# Patient Record
Sex: Female | Born: 1965 | Race: White | Hispanic: No | Marital: Single | Smoking: Never smoker
Health system: Southern US, Community
[De-identification: ages and names within clinical notes are randomized; demographics above are authoritative.]

## PROBLEM LIST (undated history)

## (undated) DIAGNOSIS — Z780 Asymptomatic menopausal state: Secondary | ICD-10-CM

## (undated) DIAGNOSIS — F329 Major depressive disorder, single episode, unspecified: Secondary | ICD-10-CM

## (undated) DIAGNOSIS — E538 Deficiency of other specified B group vitamins: Secondary | ICD-10-CM

## (undated) DIAGNOSIS — K644 Residual hemorrhoidal skin tags: Secondary | ICD-10-CM

## (undated) DIAGNOSIS — F419 Anxiety disorder, unspecified: Secondary | ICD-10-CM

## (undated) DIAGNOSIS — E0500 Thyrotoxicosis with diffuse goiter without thyrotoxic crisis or storm: Secondary | ICD-10-CM

## (undated) DIAGNOSIS — F32A Depression, unspecified: Secondary | ICD-10-CM

## (undated) DIAGNOSIS — R251 Tremor, unspecified: Secondary | ICD-10-CM

## (undated) DIAGNOSIS — N762 Acute vulvitis: Secondary | ICD-10-CM

## (undated) DIAGNOSIS — E039 Hypothyroidism, unspecified: Secondary | ICD-10-CM

## (undated) HISTORY — DX: Major depressive disorder, single episode, unspecified: F32.9

## (undated) HISTORY — PX: REFRACTIVE SURGERY: SHX103

## (undated) HISTORY — DX: Deficiency of other specified B group vitamins: E53.8

## (undated) HISTORY — PX: FOOT SURGERY: SHX648

## (undated) HISTORY — DX: Asymptomatic menopausal state: Z78.0

## (undated) HISTORY — DX: Residual hemorrhoidal skin tags: K64.4

## (undated) HISTORY — DX: Tremor, unspecified: R25.1

## (undated) HISTORY — DX: Thyrotoxicosis with diffuse goiter without thyrotoxic crisis or storm: E05.00

## (undated) HISTORY — DX: Hypothyroidism, unspecified: E03.9

## (undated) HISTORY — PX: OTHER SURGICAL HISTORY: SHX169

## (undated) HISTORY — DX: Depression, unspecified: F32.A

## (undated) HISTORY — DX: Acute vulvitis: N76.2

## (undated) HISTORY — DX: Anxiety disorder, unspecified: F41.9

---

## 1998-12-07 ENCOUNTER — Other Ambulatory Visit: Admission: RE | Admit: 1998-12-07 | Discharge: 1998-12-07 | Payer: Self-pay | Admitting: Gynecology

## 1998-12-28 ENCOUNTER — Encounter (INDEPENDENT_AMBULATORY_CARE_PROVIDER_SITE_OTHER): Payer: Self-pay

## 1998-12-28 ENCOUNTER — Other Ambulatory Visit: Admission: RE | Admit: 1998-12-28 | Discharge: 1998-12-28 | Payer: Self-pay | Admitting: *Deleted

## 1999-01-17 ENCOUNTER — Encounter (INDEPENDENT_AMBULATORY_CARE_PROVIDER_SITE_OTHER): Payer: Self-pay | Admitting: Specialist

## 1999-01-17 ENCOUNTER — Other Ambulatory Visit: Admission: RE | Admit: 1999-01-17 | Discharge: 1999-01-17 | Payer: Self-pay | Admitting: *Deleted

## 1999-06-29 ENCOUNTER — Other Ambulatory Visit: Admission: RE | Admit: 1999-06-29 | Discharge: 1999-06-29 | Payer: Self-pay | Admitting: Obstetrics and Gynecology

## 2001-01-01 ENCOUNTER — Other Ambulatory Visit: Admission: RE | Admit: 2001-01-01 | Discharge: 2001-01-01 | Payer: Self-pay | Admitting: Gynecology

## 2003-01-12 ENCOUNTER — Other Ambulatory Visit: Admission: RE | Admit: 2003-01-12 | Discharge: 2003-01-12 | Payer: Self-pay | Admitting: Obstetrics and Gynecology

## 2003-09-07 ENCOUNTER — Other Ambulatory Visit: Admission: RE | Admit: 2003-09-07 | Discharge: 2003-09-07 | Payer: Self-pay | Admitting: Obstetrics and Gynecology

## 2004-10-11 ENCOUNTER — Other Ambulatory Visit: Admission: RE | Admit: 2004-10-11 | Discharge: 2004-10-11 | Payer: Self-pay | Admitting: Obstetrics and Gynecology

## 2005-11-19 ENCOUNTER — Ambulatory Visit: Payer: Self-pay | Admitting: Family Medicine

## 2005-11-21 ENCOUNTER — Ambulatory Visit: Payer: Self-pay | Admitting: Family Medicine

## 2005-11-23 ENCOUNTER — Ambulatory Visit: Payer: Self-pay | Admitting: Family Medicine

## 2006-01-11 ENCOUNTER — Ambulatory Visit: Payer: Self-pay | Admitting: Family Medicine

## 2006-03-15 ENCOUNTER — Ambulatory Visit: Payer: Self-pay | Admitting: Family Medicine

## 2006-07-12 ENCOUNTER — Ambulatory Visit: Payer: Self-pay | Admitting: Family Medicine

## 2006-07-12 LAB — CONVERTED CEMR LAB: TSH: 15.519 microintl units/mL — ABNORMAL HIGH (ref 0.350–5.50)

## 2006-09-02 DIAGNOSIS — F329 Major depressive disorder, single episode, unspecified: Secondary | ICD-10-CM | POA: Insufficient documentation

## 2006-09-02 DIAGNOSIS — Z862 Personal history of diseases of the blood and blood-forming organs and certain disorders involving the immune mechanism: Secondary | ICD-10-CM

## 2006-09-02 DIAGNOSIS — Z8639 Personal history of other endocrine, nutritional and metabolic disease: Secondary | ICD-10-CM

## 2006-10-28 ENCOUNTER — Ambulatory Visit: Payer: Self-pay | Admitting: Family Medicine

## 2006-11-01 LAB — CONVERTED CEMR LAB
T3, Free: 2.9 pg/mL (ref 2.3–4.2)
TSH: 0.18 microintl units/mL — ABNORMAL LOW (ref 0.35–5.50)

## 2006-11-11 ENCOUNTER — Telehealth (INDEPENDENT_AMBULATORY_CARE_PROVIDER_SITE_OTHER): Payer: Self-pay | Admitting: *Deleted

## 2006-12-26 ENCOUNTER — Telehealth (INDEPENDENT_AMBULATORY_CARE_PROVIDER_SITE_OTHER): Payer: Self-pay | Admitting: *Deleted

## 2006-12-27 ENCOUNTER — Ambulatory Visit: Payer: Self-pay | Admitting: Family Medicine

## 2006-12-27 DIAGNOSIS — R5383 Other fatigue: Secondary | ICD-10-CM

## 2006-12-27 DIAGNOSIS — G250 Essential tremor: Secondary | ICD-10-CM

## 2006-12-27 DIAGNOSIS — G252 Other specified forms of tremor: Secondary | ICD-10-CM | POA: Insufficient documentation

## 2006-12-27 DIAGNOSIS — R5381 Other malaise: Secondary | ICD-10-CM

## 2006-12-30 LAB — CONVERTED CEMR LAB
ALT: 20 units/L (ref 0–35)
AST: 20 units/L (ref 0–37)
Alkaline Phosphatase: 41 units/L (ref 39–117)
BUN: 7 mg/dL (ref 6–23)
Basophils Relative: 0.4 % (ref 0.0–1.0)
Bilirubin, Direct: 0.1 mg/dL (ref 0.0–0.3)
CO2: 28 meq/L (ref 19–32)
Calcium: 9.5 mg/dL (ref 8.4–10.5)
Chloride: 106 meq/L (ref 96–112)
Creatinine, Ser: 0.9 mg/dL (ref 0.4–1.2)
Eosinophils Relative: 0.5 % (ref 0.0–5.0)
Glucose, Bld: 87 mg/dL (ref 70–99)
HCT: 39.4 % (ref 36.0–46.0)
Hemoglobin: 13.7 g/dL (ref 12.0–15.0)
MCHC: 34.8 g/dL (ref 30.0–36.0)
Monocytes Absolute: 0.3 10*3/uL (ref 0.2–0.7)
Neutrophils Relative %: 72.3 % (ref 43.0–77.0)
RDW: 13 % (ref 11.5–14.6)
Total Protein: 7.7 g/dL (ref 6.0–8.3)
WBC: 4.5 10*3/uL (ref 4.5–10.5)

## 2007-01-01 ENCOUNTER — Telehealth (INDEPENDENT_AMBULATORY_CARE_PROVIDER_SITE_OTHER): Payer: Self-pay | Admitting: *Deleted

## 2007-01-03 ENCOUNTER — Ambulatory Visit: Payer: Self-pay | Admitting: Family Medicine

## 2007-01-03 DIAGNOSIS — F411 Generalized anxiety disorder: Secondary | ICD-10-CM | POA: Insufficient documentation

## 2007-04-08 ENCOUNTER — Telehealth (INDEPENDENT_AMBULATORY_CARE_PROVIDER_SITE_OTHER): Payer: Self-pay | Admitting: *Deleted

## 2007-04-10 ENCOUNTER — Ambulatory Visit: Payer: Self-pay | Admitting: Family Medicine

## 2007-04-11 LAB — CONVERTED CEMR LAB: TSH: 4.08 microintl units/mL (ref 0.35–5.50)

## 2007-08-26 ENCOUNTER — Ambulatory Visit: Payer: Self-pay | Admitting: Family Medicine

## 2007-08-26 DIAGNOSIS — N760 Acute vaginitis: Secondary | ICD-10-CM | POA: Insufficient documentation

## 2007-08-26 DIAGNOSIS — K5900 Constipation, unspecified: Secondary | ICD-10-CM | POA: Insufficient documentation

## 2007-08-28 ENCOUNTER — Telehealth (INDEPENDENT_AMBULATORY_CARE_PROVIDER_SITE_OTHER): Payer: Self-pay | Admitting: *Deleted

## 2007-08-28 ENCOUNTER — Encounter (INDEPENDENT_AMBULATORY_CARE_PROVIDER_SITE_OTHER): Payer: Self-pay | Admitting: *Deleted

## 2007-09-02 ENCOUNTER — Ambulatory Visit: Payer: Self-pay | Admitting: Family Medicine

## 2007-09-02 LAB — CONVERTED CEMR LAB
T3, Free: 2.7 pg/mL (ref 2.3–4.2)
TSH: 0.97 microintl units/mL (ref 0.35–5.50)

## 2007-09-03 ENCOUNTER — Encounter (INDEPENDENT_AMBULATORY_CARE_PROVIDER_SITE_OTHER): Payer: Self-pay | Admitting: *Deleted

## 2008-01-21 ENCOUNTER — Telehealth (INDEPENDENT_AMBULATORY_CARE_PROVIDER_SITE_OTHER): Payer: Self-pay | Admitting: *Deleted

## 2008-01-28 ENCOUNTER — Encounter (INDEPENDENT_AMBULATORY_CARE_PROVIDER_SITE_OTHER): Payer: Self-pay | Admitting: *Deleted

## 2008-02-09 ENCOUNTER — Encounter: Payer: Self-pay | Admitting: Family Medicine

## 2008-02-10 ENCOUNTER — Ambulatory Visit: Payer: Self-pay

## 2008-02-18 ENCOUNTER — Telehealth: Payer: Self-pay | Admitting: Family Medicine

## 2008-06-29 ENCOUNTER — Ambulatory Visit: Payer: Self-pay | Admitting: Family Medicine

## 2008-09-30 ENCOUNTER — Ambulatory Visit: Payer: Self-pay | Admitting: Family Medicine

## 2008-09-30 DIAGNOSIS — K644 Residual hemorrhoidal skin tags: Secondary | ICD-10-CM | POA: Insufficient documentation

## 2008-10-01 ENCOUNTER — Encounter (INDEPENDENT_AMBULATORY_CARE_PROVIDER_SITE_OTHER): Payer: Self-pay | Admitting: *Deleted

## 2008-10-18 ENCOUNTER — Telehealth (INDEPENDENT_AMBULATORY_CARE_PROVIDER_SITE_OTHER): Payer: Self-pay | Admitting: *Deleted

## 2008-10-21 ENCOUNTER — Encounter (INDEPENDENT_AMBULATORY_CARE_PROVIDER_SITE_OTHER): Payer: Self-pay | Admitting: *Deleted

## 2008-10-21 ENCOUNTER — Ambulatory Visit: Payer: Self-pay | Admitting: Family Medicine

## 2008-10-21 LAB — CONVERTED CEMR LAB: TSH: 7.21 microintl units/mL — ABNORMAL HIGH (ref 0.35–5.50)

## 2008-11-12 ENCOUNTER — Ambulatory Visit: Payer: Self-pay | Admitting: Family Medicine

## 2008-11-12 DIAGNOSIS — R079 Chest pain, unspecified: Secondary | ICD-10-CM

## 2008-11-15 ENCOUNTER — Telehealth (INDEPENDENT_AMBULATORY_CARE_PROVIDER_SITE_OTHER): Payer: Self-pay | Admitting: *Deleted

## 2008-11-16 ENCOUNTER — Telehealth (INDEPENDENT_AMBULATORY_CARE_PROVIDER_SITE_OTHER): Payer: Self-pay | Admitting: *Deleted

## 2008-12-23 ENCOUNTER — Telehealth (INDEPENDENT_AMBULATORY_CARE_PROVIDER_SITE_OTHER): Payer: Self-pay | Admitting: *Deleted

## 2009-01-27 ENCOUNTER — Ambulatory Visit: Payer: Self-pay | Admitting: Cardiology

## 2009-01-28 ENCOUNTER — Ambulatory Visit: Payer: Self-pay | Admitting: Cardiology

## 2009-01-31 LAB — CONVERTED CEMR LAB
ALT: 10 units/L (ref 0–35)
AST: 19 units/L (ref 0–37)
Alkaline Phosphatase: 37 units/L — ABNORMAL LOW (ref 39–117)
Bilirubin, Direct: 0 mg/dL (ref 0.0–0.3)
Cholesterol: 169 mg/dL (ref 0–200)
Total Bilirubin: 0.8 mg/dL (ref 0.3–1.2)
Total Protein: 7.1 g/dL (ref 6.0–8.3)
VLDL: 19.2 mg/dL (ref 0.0–40.0)

## 2009-02-01 LAB — CONVERTED CEMR LAB
ALT: 10 units/L (ref 0–35)
AST: 13 units/L (ref 0–37)
Albumin: 3.6 g/dL (ref 3.5–5.2)
Alkaline Phosphatase: 38 units/L — ABNORMAL LOW (ref 39–117)
Cholesterol: 168 mg/dL (ref 0–200)
HDL: 43.3 mg/dL (ref >39.00)
Total Protein: 6.9 g/dL (ref 6.0–8.3)
Triglycerides: 94 mg/dL (ref 0.0–149.0)

## 2009-02-14 ENCOUNTER — Ambulatory Visit: Payer: Self-pay

## 2009-02-14 ENCOUNTER — Ambulatory Visit: Payer: Self-pay | Admitting: Cardiology

## 2009-03-14 ENCOUNTER — Telehealth: Payer: Self-pay | Admitting: Cardiology

## 2009-03-15 DIAGNOSIS — R0602 Shortness of breath: Secondary | ICD-10-CM

## 2009-04-25 ENCOUNTER — Ambulatory Visit: Payer: Self-pay | Admitting: Internal Medicine

## 2009-06-14 ENCOUNTER — Ambulatory Visit: Payer: Self-pay | Admitting: Family Medicine

## 2009-06-14 DIAGNOSIS — Z780 Asymptomatic menopausal state: Secondary | ICD-10-CM | POA: Insufficient documentation

## 2009-06-15 ENCOUNTER — Telehealth (INDEPENDENT_AMBULATORY_CARE_PROVIDER_SITE_OTHER): Payer: Self-pay | Admitting: *Deleted

## 2009-06-15 ENCOUNTER — Encounter (INDEPENDENT_AMBULATORY_CARE_PROVIDER_SITE_OTHER): Payer: Self-pay | Admitting: *Deleted

## 2009-06-17 ENCOUNTER — Telehealth: Payer: Self-pay | Admitting: Family Medicine

## 2009-06-20 ENCOUNTER — Telehealth (INDEPENDENT_AMBULATORY_CARE_PROVIDER_SITE_OTHER): Payer: Self-pay | Admitting: *Deleted

## 2009-06-20 LAB — CONVERTED CEMR LAB
Basophils Relative: 0.9 % (ref 0.0–3.0)
CO2: 28 meq/L (ref 19–32)
Calcium: 9.2 mg/dL (ref 8.4–10.5)
Chloride: 108 meq/L (ref 96–112)
Eosinophils Absolute: 0 10*3/uL (ref 0.0–0.7)
Folate: 8.9 ng/mL
Free T4: 1.3 ng/dL (ref 0.6–1.6)
HCT: 38.3 % (ref 36.0–46.0)
Hemoglobin: 12.7 g/dL (ref 12.0–15.0)
Lymphocytes Relative: 26.5 % (ref 12.0–46.0)
Lymphs Abs: 1.5 10*3/uL (ref 0.7–4.0)
MCHC: 33.1 g/dL (ref 30.0–36.0)
MCV: 98.2 fL (ref 78.0–100.0)
Neutro Abs: 3.6 10*3/uL (ref 1.4–7.7)
Potassium: 4.3 meq/L (ref 3.5–5.1)
RBC: 3.9 M/uL (ref 3.87–5.11)
Sodium: 139 meq/L (ref 135–145)

## 2009-06-21 ENCOUNTER — Ambulatory Visit: Payer: Self-pay | Admitting: Family Medicine

## 2009-06-21 DIAGNOSIS — E538 Deficiency of other specified B group vitamins: Secondary | ICD-10-CM

## 2009-06-28 ENCOUNTER — Ambulatory Visit: Payer: Self-pay | Admitting: Family Medicine

## 2009-07-08 ENCOUNTER — Ambulatory Visit: Payer: Self-pay | Admitting: Family Medicine

## 2009-07-13 ENCOUNTER — Ambulatory Visit: Payer: Self-pay | Admitting: Family Medicine

## 2009-08-24 ENCOUNTER — Ambulatory Visit: Payer: Self-pay | Admitting: Family Medicine

## 2009-08-26 LAB — CONVERTED CEMR LAB: Vitamin B-12: 326 pg/mL (ref 211–911)

## 2009-10-05 ENCOUNTER — Telehealth: Payer: Self-pay | Admitting: Family Medicine

## 2009-10-13 ENCOUNTER — Encounter (INDEPENDENT_AMBULATORY_CARE_PROVIDER_SITE_OTHER): Payer: Self-pay | Admitting: *Deleted

## 2009-12-09 ENCOUNTER — Telehealth (INDEPENDENT_AMBULATORY_CARE_PROVIDER_SITE_OTHER): Payer: Self-pay | Admitting: *Deleted

## 2009-12-09 DIAGNOSIS — K625 Hemorrhage of anus and rectum: Secondary | ICD-10-CM | POA: Insufficient documentation

## 2009-12-13 ENCOUNTER — Encounter (INDEPENDENT_AMBULATORY_CARE_PROVIDER_SITE_OTHER): Payer: Self-pay | Admitting: *Deleted

## 2010-04-05 ENCOUNTER — Encounter (INDEPENDENT_AMBULATORY_CARE_PROVIDER_SITE_OTHER): Payer: Self-pay | Admitting: *Deleted

## 2010-05-16 ENCOUNTER — Ambulatory Visit: Payer: Self-pay | Admitting: Family Medicine

## 2010-05-16 DIAGNOSIS — E039 Hypothyroidism, unspecified: Secondary | ICD-10-CM

## 2010-05-17 LAB — CONVERTED CEMR LAB: Free T4: 1.21 ng/dL (ref 0.60–1.60)

## 2010-06-09 ENCOUNTER — Ambulatory Visit
Admission: RE | Admit: 2010-06-09 | Discharge: 2010-06-09 | Payer: Self-pay | Source: Home / Self Care | Attending: Internal Medicine | Admitting: Internal Medicine

## 2010-06-25 LAB — CONVERTED CEMR LAB
BUN: 13 mg/dL (ref 6–23)
Basophils Absolute: 0 10*3/uL (ref 0.0–0.1)
CK-MB: 0.5 ng/mL (ref 0.3–4.0)
Chloride: 104 meq/L (ref 96–112)
Eosinophils Absolute: 0 10*3/uL (ref 0.0–0.7)
GFR calc non Af Amer: 71.9 mL/min (ref >60)
Hemoglobin: 12.6 g/dL (ref 12.0–15.0)
Lymphocytes Relative: 25.7 % (ref 12.0–46.0)
MCHC: 34.5 g/dL (ref 30.0–36.0)
Monocytes Relative: 7.1 % (ref 3.0–12.0)
Neutro Abs: 4.6 10*3/uL (ref 1.4–7.7)
Neutrophils Relative %: 66.8 % (ref 43.0–77.0)
Potassium: 3.9 meq/L (ref 3.5–5.1)
RBC: 3.83 M/uL — ABNORMAL LOW (ref 3.87–5.11)
RDW: 12.9 % (ref 11.5–14.6)
Sodium: 141 meq/L (ref 135–145)
Total CK: 31 units/L (ref 7–177)

## 2010-06-27 NOTE — Assessment & Plan Note (Signed)
Summary: 1st b12 injection//fd  Nurse Visit   Allergies: 1)  ! Codeine  Medication Administration  Injection # 1:    Medication: Vit B12 1000 mcg    Diagnosis: B12 DEFICIENCY (ICD-266.2)    Route: IM    Site: R deltoid    Exp Date: 02/2011    Lot #: [id number]    Mfr: American Regent    Patient tolerated injection without complications    Given by: Floydene Flock (June 21, 2009 1:26 PM)  Orders Added: 1)  Admin of Therapeutic Inj  intramuscular or subcutaneous [96372] 2)  Vit B12 1000 mcg [J3420]   Medication Administration  Injection # 1:    Medication: Vit B12 1000 mcg    Diagnosis: B12 DEFICIENCY (ICD-266.2)    Route: IM    Site: R deltoid    Exp Date: 02/2011    Lot #: [id number]    Mfr: American Regent    Patient tolerated injection without complications    Given by: Floydene Flock (June 21, 2009 1:26 PM)  Orders Added: 1)  Admin of Therapeutic Inj  intramuscular or subcutaneous [96372] 2)  Vit B12 1000 mcg [J3420]

## 2010-06-27 NOTE — Assessment & Plan Note (Signed)
Summary: b12 shot/kdc  Nurse Visit   Allergies: 1)  ! Codeine  Medication Administration  Injection # 1:    Medication: Vit B12 1000 mcg    Diagnosis: B12 DEFICIENCY (ICD-266.2)    Route: IM    Site: R deltoid    Exp Date: 02/2011    Lot #: [id number]    Mfr: American Regent    Patient tolerated injection without complications    Given by: Floydene Flock (July 08, 2009 4:01 PM)  Orders Added: 1)  Vit B12 1000 mcg [J3420] 2)  Admin of Therapeutic Inj  intramuscular or subcutaneous [96372]   Medication Administration  Injection # 1:    Medication: Vit B12 1000 mcg    Diagnosis: B12 DEFICIENCY (ICD-266.2)    Route: IM    Site: R deltoid    Exp Date: 02/2011    Lot #: [id number]    Mfr: American Regent    Patient tolerated injection without complications    Given by: Rene Kocher Hoops (July 08, 2009 4:01 PM)  Orders Added: 1)  Vit B12 1000 mcg [J3420] 2)  Admin of Therapeutic Inj  intramuscular or subcutaneous [43329]

## 2010-06-27 NOTE — Progress Notes (Signed)
Summary: Lab Results   Phone Note Outgoing Call   Summary of Call: Regarding lab results, LMTCB:  pt probably is perimenopausal but we need rest of labs.  once all labs are back---fax to gyn Signed by Loreen Freud DO on 06/15/2009 at 6:32 AM  Follow-up for Phone Call        Pt is aware. Army Fossa CMA  June 22, 2009 11:30 AM

## 2010-06-27 NOTE — Progress Notes (Signed)
Summary: Lab Results   Phone Note Call from Patient Call back at Home Phone [phone]   Complaint: Breathing Problems Summary of Call: Pt called and would like to know her lab results, mostly if her thyroid medication is going to change. Please advise. Army Fossa CMA  June 17, 2009 3:48 PM   Follow-up for Phone Call        Pt is aware to continue same dosage of thyroid meds. Army Fossa CMA  June 17, 2009 5:08 PM      Prescriptions: LEVOXYL 150 MCG TABS (LEVOTHYROXINE SODIUM) Take one tablet daily  #30.0 Each x 2   Entered by:   Army Fossa CMA   Authorized by:   Loreen Freud DO   Signed by:   Army Fossa CMA on 06/17/2009   Method used:   Electronically to        [id number]* (retail)       [address]       [place], [place]  [address]       Ph: [phone]       Fax: [phone]   RxID:   [id number]

## 2010-06-27 NOTE — Assessment & Plan Note (Signed)
Summary: FEELING "OUT OF SORTS", EARLY MENOPAUSE??///SPH   Vital Signs:  Patient profile:   45 year old female Weight:      138 pounds Temp:     98.6 degrees F oral Pulse rate:   85 / minute Pulse rhythm:   regular BP sitting:   118 / 76  (left arm) Cuff size:   regular  Vitals Entered By: Army Fossa CMA (June 14, 2009 9:50 AM) CC: Pt here c/o feeling "like she is in a fog" would like to discuss having her hormone levels checked. , Depressive symptoms   History of Present Illness:  Depressive symptoms      This is a 45 year old woman who presents with Depressive symptoms.  The patient reports depressed mood and loss of interest/pleasure, but denies significant weight loss, significant weight gain, insomnia, hypersomnia, psychomotor agitation, and psychomotor retardation.  The patient also reports fatigue or loss of energy, feelings of worthlessness, and diminished concentration.  The patient denies indecisiveness, thoughts of death, thoughts of suicide, suicidal intent, and suicidal plans.  Patient's past history includes depression.  The patient denies abnormally elevated mood, abnormally irritable mood, decreased need for sleep, increased talkativeness, distractibility, flight of ideas, increased goal-directed activity, and inflated self-esteem/ grandiosity.  Pt concerned about menopause.  Periods are changing but have not stopped.    Preventive Screening-Counseling & Management  Alcohol-Tobacco     Smoking Status: never  Caffeine-Diet-Exercise     Does Patient Exercise: no     Exercise Counseling: to improve exercise regimen  Current Medications (verified): 1)  Levoxyl 150 Mcg Tabs (Levothyroxine Sodium) .... Take One Tablet Daily 2)  Klonopin 1 Mg Tabs (Clonazepam) .Marland Kitchen.. 1 By Mouth Two Times A Day 3)  Paxil Cr 25 Mg Xr24h-Tab (Paroxetine Hcl) .Marland Kitchen.. 1 By Mouth Once Daily  Allergies: 1)  ! Codeine  Past History:  Past medical, surgical, family and social histories  (including risk factors) reviewed for relevance to current acute and chronic problems.  Past Medical History: Reviewed history from 04/25/2009 and no changes required. CHEST PAIN (ICD-786.50)     - onset May 2010     - neg gxt 02/14/2009 EXTERNAL HEMORRHOIDS WITHOUT MENTION COMP (ICD-455.3) FAMILY HISTORY DEPRESSION (ICD-V17.0) VULVITIS (ICD-616.10) CONSTIPATION (ICD-564.00) ANXIETY STATE NOS (ICD-300.00) MALAISE AND FATIGUE (ICD-780.79) TREMOR NEC (ICD-333.1) DEPRESSION (ICD-311) GRAVE'S DISEASE, HX OF (ICD-V12.2)  Past Surgical History: Reviewed history from 01/27/2009 and no changes required. None  Family History: Reviewed history from 04/25/2009 and no changes required. History Depression Significant for liver cancer in father who died at age 32. Mother  with HTN and High Cholesterol, she recently had three stents (age 42)  Brother with Lyme disease. Sister healthy neg clotting disorders  Social History: Reviewed history from 01/27/2009 and no changes required. Married with 2 children (age 11, 75) Alcohol Use - no She does not drink excess caffeine Never smoked. Does Patient Exercise:  no  Review of Systems      See HPI  Physical Exam  General:  Well-developed,well-nourished,in no acute distress; alert,appropriate and cooperative throughout examination Psych:  Oriented X3, normally interactive, good eye contact, depressed affect, and subdued.     Impression & Recommendations:  Problem # 1:  DEPRESSION (ICD-311)  Her updated medication list for this problem includes:    Klonopin 1 Mg Tabs (Clonazepam) .Marland Kitchen... 1 by mouth two times a day    Paxil Cr 25 Mg Xr24h-tab (Paroxetine hcl) .Marland Kitchen... 1 by mouth once daily  Orders: Venipuncture (29562) TLB-B12 +  Folate Pnl (82746_82607-B12/FOL) TLB-BMP (Basic Metabolic Panel-BMET) (80048-METABOL) TLB-CBC Platelet - w/Differential (85025-CBCD) TLB-TSH (Thyroid Stimulating Hormone) (84443-TSH) TLB-T3, Free (Triiodothyronine)  (84481-T3FREE) TLB-T4 (Thyrox), Free [phone]) TLB-Luteinizing Hormone (LH) (83002-LH) TLB-FSH (Follicle Stimulating Hormone) (83001-FSH) T- * Misc. Laboratory test [phone])  Discussed treatment options, including trial of antidpressant medication. Will refer to behavioral health. Follow-up call in in 24-48 hours and recheck in 2 weeks, sooner as needed. Patient agrees to call if any worsening of symptoms or thoughts of doing harm arise. Verified that the patient has no suicidal ideation at this time.   Problem # 2:  PERIMENOPAUSAL STATUS (ICD-V49.81)  Orders: Gynecologic Referral (Gyn) Venipuncture (64403) TLB-B12 + Folate Pnl (47425_95638-V56/EPP) TLB-BMP (Basic Metabolic Panel-BMET) (80048-METABOL) TLB-CBC Platelet - w/Differential (85025-CBCD) TLB-TSH (Thyroid Stimulating Hormone) (84443-TSH) TLB-T3, Free (Triiodothyronine) (84481-T3FREE) TLB-T4 (Thyrox), Free [phone]) TLB-Luteinizing Hormone (LH) (83002-LH) TLB-FSH (Follicle Stimulating Hormone) (83001-FSH) T- * Misc. Laboratory test [phone]) T- * Misc. Laboratory test [phone])  Problem # 3:  GRAVE'S DISEASE, HX OF (ICD-V12.2)  Orders: Venipuncture (93235) TLB-B12 + Folate Pnl (57322_02542-H06/CBJ) TLB-BMP (Basic Metabolic Panel-BMET) (80048-METABOL) TLB-CBC Platelet - w/Differential (85025-CBCD) TLB-TSH (Thyroid Stimulating Hormone) (84443-TSH) TLB-T3, Free (Triiodothyronine) (84481-T3FREE) TLB-T4 (Thyrox), Free [phone]) TLB-Luteinizing Hormone (LH) (83002-LH) TLB-FSH (Follicle Stimulating Hormone) (83001-FSH) T- * Misc. Laboratory test [phone])  Problem # 4:  MALAISE AND FATIGUE (ICD-780.79)  Complete Medication List: 1)  Levoxyl 150 Mcg Tabs (Levothyroxine sodium) .... Take one tablet daily 2)  Klonopin 1 Mg Tabs (Clonazepam) .Marland Kitchen.. 1 by mouth two times a day 3)  Paxil Cr 25 Mg Xr24h-tab (Paroxetine hcl) .Marland Kitchen.. 1 by mouth once daily Prescriptions: PAXIL CR 25 MG XR24H-TAB (PAROXETINE HCL) 1 by mouth once daily   #30 x 2   Entered and Authorized by:   Loreen Freud DO   Signed by:   Loreen Freud DO on 06/14/2009   Method used:   Print then Give to Patient   RxID:   [id number] PAXIL CR 12.5 MG XR24H-TAB (PAROXETINE HCL) 1 by mouth once daily for 1 week then increase to 2 a day  #60 x 0   Entered and Authorized by:   Loreen Freud DO   Signed by:   Loreen Freud DO on 06/14/2009   Method used:   Electronically to        [id number]* (retail)       [address]       [place], [place]  [address]       Ph: [phone]       Fax: [phone]   RxID:   [id number]

## 2010-06-27 NOTE — Progress Notes (Signed)
Summary: IMPORTANT- Triage (Rectal Bleeding)  Phone Note Call from Patient Call back at Home Phone [phone]   Caller: Patient Summary of Call: Message left on Triage VM: Patient was referred to GI before and did NOT go. Now patient would like to reconsider due to rectal bleeding, patient would like to see the lady GI doctor and would like for [place] to set up so she can get in sooner.    Shonna Chock CMA  December 09, 2009 1:55 PM   Follow-up for Phone Call        pt c/o rectal bleeding on and off. pt denies any abdominal pain or swelling or tenderness. pt states that she has a hx of hemorrhoid.........Marland KitchenFelecia Deloach CMA  December 09, 2009 2:41 PM   Additional Follow-up for Phone Call Additional follow up Details #1::        If it is no worse than before --she can wait to see GI ---if worse than before she needs to go to ER or uC today Additional Follow-up by: Loreen Freud DO,  December 09, 2009 4:43 PM  New Problems: RECTAL BLEEDING (ICD-569.3)   Additional Follow-up for Phone Call Additional follow up Details #2::    spoke w/ patient says she isn't going to urgent care or emergency room will just wait for GI referra to be competed.........Marland KitchenDoristine Devoid CMA  December 09, 2009 4:47 PM   New Problems: RECTAL BLEEDING (ICD-569.3)

## 2010-06-27 NOTE — Letter (Signed)
Summary: New Patient letter  [facility]  [address] [place], [place] [address]   Phone: [phone]  Fax: [phone]       12/13/2009 MRN: [id number]  [facility] [address] [place], [place]  [address]  Dear Diamond Watts,  Welcome to the Gastroenterology Division at [facility].    You are scheduled to see Dr.  [name] on February 21, 2010 at 9:15am on the 3rd floor at [facility], [address].  We ask that you try to arrive at our office 15 minutes prior to your appointment time to allow for check-in.  We would like you to complete the enclosed self-administered evaluation form prior to your visit and bring it with you on the day of your appointment.  We will review it with you.  Also, please bring a complete list of all your medications or, if you prefer, bring the medication bottles and we will list them.  Please bring your insurance card so that we may make a copy of it.  If your insurance requires a referral to see a specialist, please bring your referral form from your primary care physician.  Co-payments are due at the time of your visit and may be paid by cash, check or credit card.     Your office visit will consist of a consult with your physician (includes a physical exam), any laboratory testing he/she may order, scheduling of any necessary diagnostic testing (e.g. x-ray, ultrasound, CT-scan), and scheduling of a procedure (e.g. Endoscopy, Colonoscopy) if required.  Please allow enough time on your schedule to allow for any/all of these possibilities.    If you cannot keep your appointment, please call [phone] to cancel or reschedule prior to your appointment date.  This allows [place] the opportunity to schedule an appointment for another patient in need of care.  If you do not cancel or reschedule by 5 p.m. the business day prior to your appointment date, you will be charged a $50.00 late cancellation/no-show fee.    Thank you  for choosing [facility] for your medical needs.  We appreciate the opportunity to care for you.  Please visit [place] at our website  to learn more about our practice.                     Sincerely,                                                             The Gastroenterology Division

## 2010-06-27 NOTE — Progress Notes (Signed)
Summary: Paxil -(lmom 5/16,5/17,5/18)  Phone Note Call from Patient Call back at [facility] Phone [phone]   Summary of Call: Pt called and is now interested in weaning off Paxil. Please advise. Army Fossa CMA  Oct 05, 2009 4:41 PM   Follow-up for Phone Call        lower to paxil cr 12.5 1 by mouth once daily for at least 2 weeks and up to 4. Follow-up by: Loreen Freud DO,  Oct 05, 2009 4:58 PM  Additional Follow-up for Phone Call Additional follow up Details #1::        Left message on pts home and work number. Army Fossa CMA  Oct 06, 2009 8:16 AM     Additional Follow-up for Phone Call Additional follow up Details #2::    Called number listed as work number(verified corret number dialed) and was told by a female that I had the wrong number " I dont know an Jaaliyah" the man stated.  Left message on machine for patient to return call when avaliable, Reason for call:  Discuss Instruction for weaning off med./Chrae [facility]  Oct 07, 2009 11:49 AM    Patient called back to say that going from 50mg  to 12.5mg  seems like a big jump. Patient said she would like for me to run this back by [facility] and make sure that she is certain about that. Patient said she was thinking 25mg  x 1 week then 12.mg x 1-2 weeks then d/c. Patient said to just ask Dr.Lowne about this again (aware Dr.Lowne out of the office)  Dr.Lowne please see patient's response to your instruction./Chrae [facility]  Oct 07, 2009 4:44 PM    Additional Follow-up for Phone Call Additional follow up Details #3:: Details for Additional Follow-up Action Taken: Paxil cr 25 mg was what was on her med list----- she is correct she needs to take 25 first for 2 weeks  then 12.5-----just take 2 of the 12.5 mg  and then 1 tab  left message for pt to call back. Army Fossa CMA  Oct 10, 2009 8:36 AM  left message to call back. Army Fossa CMA  Oct 11, 2009 10:08 AM  left message to call back. Army Fossa CMA  Oct 12, 2009 12:05  PM  mailed pt a letter to contact the office. Army Fossa CMA  Oct 13, 2009 8:13 AM   Additional Follow-up by: Loreen Freud DO,  Oct 08, 2009 6:59 AM  New/Updated Medications: PAXIL CR 12.5 MG XR24H-TAB (PAROXETINE HCL) 1 by mouth once daily Prescriptions: PAXIL CR 12.5 MG XR24H-TAB (PAROXETINE HCL) 1 by mouth once daily  #30 x 0   Entered and Authorized by:   Loreen Freud DO   Signed by:   Loreen Freud DO on 10/05/2009   Method used:   Electronically to        [id number]* (retail)       [address]       [place], [place]  [address]       Ph: [phone]       Fax: [phone]   RxID:   [id number]

## 2010-06-27 NOTE — Letter (Signed)
Summary: New Patient letter  [facility]  [address] [place], [place] [address]   Phone: [phone]  Fax: [phone]       04/05/2010 MRN: [id number]  [facility] [address] [place], [place]  [address]  Dear Ms. Friedhoff,  Welcome to the Gastroenterology Division at [facility].    You are scheduled to see Dr. Juanda Chance on 06/09/2010 at 2:45PM on the 3rd floor at [facility], [address].  We ask that you try to arrive at our office 15 minutes prior to your appointment time to allow for check-in.  We would like you to complete the enclosed self-administered evaluation form prior to your visit and bring it with you on the day of your appointment.  We will review it with you.  Also, please bring a complete list of all your medications or, if you prefer, bring the medication bottles and we will list them.  Please bring your insurance card so that we may make a copy of it.  If your insurance requires a referral to see a specialist, please bring your referral form from your primary care physician.  Co-payments are due at the time of your visit and may be paid by cash, check or credit card.     Your office visit will consist of a consult with your physician (includes a physical exam), any laboratory testing he/she may order, scheduling of any necessary diagnostic testing (e.g. x-ray, ultrasound, CT-scan), and scheduling of a procedure (e.g. Endoscopy, Colonoscopy) if required.  Please allow enough time on your schedule to allow for any/all of these possibilities.    If you cannot keep your appointment, please call [phone] to cancel or reschedule prior to your appointment date.  This allows [place] the opportunity to schedule an appointment for another patient in need of care.  If you do not cancel or reschedule by 5 p.m. the business day prior to your appointment date, you will be charged a $50.00 late cancellation/no-show fee.    Thank you for choosing  [facility] for your medical needs.  We appreciate the opportunity to care for you.  Please visit [place] at our website  to learn more about our practice.                     Sincerely,                                                             The Gastroenterology Division

## 2010-06-27 NOTE — Assessment & Plan Note (Signed)
Summary: b12 injection/drb  Nurse Visit   Allergies: 1)  ! Codeine  Medication Administration  Injection # 1:    Medication: Vit B12 1000 mcg    Diagnosis: B12 DEFICIENCY (ICD-266.2)    Route: IM    Site: R deltoid    Exp Date: 02/2011    Lot #: [id number]    Mfr: American Regent    Patient tolerated injection without complications    Given by: Floydene Flock (June 28, 2009 3:49 PM)  Orders Added: 1)  Admin of Therapeutic Inj  intramuscular or subcutaneous [96372] 2)  Vit B12 1000 mcg [J3420]   Medication Administration  Injection # 1:    Medication: Vit B12 1000 mcg    Diagnosis: B12 DEFICIENCY (ICD-266.2)    Route: IM    Site: R deltoid    Exp Date: 02/2011    Lot #: [id number]    Mfr: American Regent    Patient tolerated injection without complications    Given by: Floydene Flock (June 28, 2009 3:49 PM)  Orders Added: 1)  Admin of Therapeutic Inj  intramuscular or subcutaneous [96372] 2)  Vit B12 1000 mcg [J3420]

## 2010-06-27 NOTE — Assessment & Plan Note (Signed)
Summary: B12 SHOT/KDC  Nurse Visit   Allergies: 1)  ! Codeine  Medication Administration  Injection # 1:    Medication: Vit B12 1000 mcg    Diagnosis: B12 DEFICIENCY (ICD-266.2)    Route: IM    Site: L deltoid    Exp Date: 03/2011    Lot #: [id number]    Mfr: American Regent    Patient tolerated injection without complications    Given by: Floydene Flock (July 13, 2009 4:15 PM)  Orders Added: 1)  Admin of Therapeutic Inj  intramuscular or subcutaneous [96372] 2)  Vit B12 1000 mcg [J3420]   Medication Administration  Injection # 1:    Medication: Vit B12 1000 mcg    Diagnosis: B12 DEFICIENCY (ICD-266.2)    Route: IM    Site: L deltoid    Exp Date: 03/2011    Lot #: [id number]    Mfr: American Regent    Patient tolerated injection without complications    Given by: Floydene Flock (July 13, 2009 4:15 PM)  Orders Added: 1)  Admin of Therapeutic Inj  intramuscular or subcutaneous [96372] 2)  Vit B12 1000 mcg [J3420]

## 2010-06-27 NOTE — Letter (Signed)
Summary: Unable To Reach-Consult Scheduled  [facility] [place]  [address] [place], [place] [address]   Phone: [phone]  Fax: [phone]    10/13/2009 MRN: [id number]    Dear Ms. Gutterman,   We have been unable to reach you by phone.  Please contact our office with an updated phone number.      Thank you,  Army Fossa CMA  Oct 13, 2009 8:14 AM

## 2010-06-27 NOTE — Letter (Signed)
Summary: Primary Care Consult Scheduled Letter  [facility] [place]  [address] [place], [place] [address]   Phone: [phone]  Fax: [phone]      06/15/2009 MRN: [id number]  [facility] [address] [place], [place]  [address]    Dear Ms. Slabach,    We have scheduled an appointment for you.  At the recommendation of Dr. Loreen Freud, we have scheduled you a consult with Dr. Marcelle Overlie with [facility] on 06-30-2009 arrive by 9:45am.  Their address is [address], [address], [place] [address]. The office phone number is [phone].  If this appointment day and time is not convenient for you, please feel free to call the office of the doctor you are being referred to at the number listed above and reschedule the appointment.    It is important for you to keep your scheduled appointments. We are here to make sure you are given good patient care.   Thank you,    Renee, Patient Care Coordinator [facility]

## 2010-06-27 NOTE — Progress Notes (Signed)
Summary: lab results  Phone Note Outgoing Call   Summary of Call: left message to call  office..............Marland KitchenFelecia Deloach CMA  June 20, 2009 10:28 AM  b12 low normal---may benefit from b12 injection weekly x4 then monthly----- recheck 1 month con't current sythroid dose  labs faxed to Jeani Hawking MD.  Follow-up for Phone Call        pt aware of results. appt schedule............Marland KitchenFelecia Deloach CMA  June 20, 2009 2:33 PM

## 2010-06-29 NOTE — Assessment & Plan Note (Signed)
Summary: RECTAL BLEEDING,EXTERNAL HEMORRHOIDS...AS.   History of Present Illness Visit Type: Initial Consult Primary GI MD: [name] MD Primary Provider: Loreen Freud DO Requesting Provider: Loreen Freud DO Chief Complaint: Pt c/o BRB in stool after BMs and when she wipes after BMs History of Present Illness:   This is a 45 year old white female with intermittent bright red blood per rectum, mostly when she wipes but also on Top of her stool. This has been occurring for several months. There is associated pruritus around the rectum and a history of hemorrhoids since she had her children 15 and 18 years ago. She has used topical steroids recently which have been very effective in controlling the external itching but she still continues to see blood. There is no family history of colon cancer. She was constipated for several months around 1 year ago but currently is having normal regular bowel movements without straining.   GI Review of Systems      Denies abdominal pain, acid reflux, belching, bloating, chest pain, dysphagia with liquids, dysphagia with solids, heartburn, loss of appetite, nausea, vomiting, vomiting blood, weight loss, and  weight gain.      Reports hemorrhoids and  rectal bleeding.     Denies anal fissure, black tarry stools, change in bowel habit, constipation, diarrhea, diverticulosis, fecal incontinence, heme positive stool, irritable bowel syndrome, jaundice, light color stool, liver problems, and  rectal pain.    Current Medications (verified): 1)  Levoxyl 150 Mcg Tabs (Levothyroxine Sodium) .... Take One Tablet Daily 2)  Klonopin 1 Mg Tabs (Clonazepam) .Marland Kitchen.. 1 By Mouth Two Times A Day 3)  Advil 200 Mg Tabs (Ibuprofen) .... As Needed  Allergies (verified): 1)  ! Codeine  Past History:  Past Medical History: CHEST PAIN (ICD-786.50)     - onset May 2010     - neg gxt 02/14/2009 HYPOTHYROIDISM (ICD-244.9) RECTAL BLEEDING (ICD-569.3) B12 DEFICIENCY  (ICD-266.2) PERIMENOPAUSAL STATUS (ICD-V49.81) ROUTINE GYNECOLOGICAL EXAMINATION (ICD-V72.31) SHORTNESS OF BREATH (ICD-786.05) CHEST PAIN (ICD-786.50) EXTERNAL HEMORRHOIDS WITHOUT MENTION COMP (ICD-455.3) FAMILY HISTORY DEPRESSION (ICD-V17.0) VULVITIS (ICD-616.10) CONSTIPATION (ICD-564.00) ANXIETY STATE NOS (ICD-300.00) MALAISE AND FATIGUE (ICD-780.79) TREMOR NEC (ICD-333.1) DEPRESSION (ICD-311) GRAVE'S DISEASE, HX OF (ICD-V12.2)  Past Surgical History: Reviewed history from 02/14/2010 and no changes required. "Urethral Tube Stretched" Left Foot Surgery Left Eye Surgery  Family History: Reviewed history from 02/14/2010 and no changes required. History Depression Significant for liver cancer in father who died at age 50.  Mother with HTN and High Cholesterol, she recently had three stents (age 21)   Brother with Lyme disease.  Sister healthy neg clotting disorders No FH of Colon Cancer:  Social History: Sonographer  Married with 2 children (age 34, 24) Alcohol Use - one daily  She does not drink excess caffeine Never smoked.  Review of Systems       The patient complains of depression-new, fatigue, and night sweats.  The patient denies allergy/sinus, anemia, anxiety-new, arthritis/joint pain, back pain, blood in urine, breast changes/lumps, change in vision, confusion, cough, coughing up blood, fainting, fever, headaches-new, hearing problems, heart murmur, heart rhythm changes, itching, menstrual pain, muscle pains/cramps, nosebleeds, pregnancy symptoms, shortness of breath, skin rash, sleeping problems, sore throat, swelling of feet/legs, swollen lymph glands, thirst - excessive , urination - excessive , urination changes/pain, urine leakage, vision changes, and voice change.         Pertinent positive and negative review of systems were noted in the above HPI. All other ROS was otherwise negative.   Vital Signs:  Patient profile:   45 year old female Height:      67  inches Weight:      143 pounds BMI:     22.48 BSA:     1.75 Pulse rate:   96 / minute Pulse rhythm:   regular BP sitting:   116 / 74  (left arm) Cuff size:   regular  Vitals Entered By: Ok Anis CMA (June 09, 2010 2:35 PM)  Physical Exam  General:  Well developed, well nourished, no acute distress. Neck:  Supple; no masses or thyromegaly. Lungs:  Clear throughout to auscultation. Heart:  Regular rate and rhythm; no murmurs, rubs,  or bruits. Abdomen:  Soft, nontender and nondistended. No masses, hepatosplenomegaly or hernias noted. Normal bowel sounds. Rectal:  rectal and anoscopic exam revealed external hemorrhoidal tags which did not show any stigmata of bleeding. There was no fissure or perianal disease. Rectal tone was normal. There are first grade internal hemorrhoids. One second grade  hemorrhoid partially prolapsing through the rectal os. Stool is Hemoccult negative. Internal hemorrhoids are hyperemic but there is no overt bleeding. Extremities:  No clubbing, cyanosis, edema or deformities noted. Psych:  Alert and cooperative. Normal mood and affect.   Impression & Recommendations:  Problem # 1:  RECTAL BLEEDING (ICD-569.3) Patient has symptomatic first and second grade hemorrhoids. She had a hemorrhoidal banding 10 years ago but has recurrence of internal hemorrhoids. We will treat it with a combination of Analpram cream and Anusol-HC suppositories. In the next several months, if her  hemorrhoidal bleeding continues, we will proceed with a colonoscopy.  Patient Instructions: 1)  patient information booklet given on hemorrhoids. 2)  Analpram cream 2.5% applied b.i.d. externally 3)  Anusol-HC suppository use q.h.s. with 3 refills. 4)  If bleeding continues over the next several months, we may need to consider a colonoscopy. Otherwise, you will be due for a screening colonoscopy at age 34. 5)  Copy sent to : Dr Laury Axon 6)  The medication list was reviewed and reconciled.   All changed / newly prescribed medications were explained.  A complete medication list was provided to the patient / caregiver. Prescriptions: ANALPRAM-HC 1-2.5 % CREA (HYDROCORTISONE ACE-PRAMOXINE) Apply to rectum two times a day as needed  #30 grams x 0   Entered by:   Lamona Curl CMA (AAMA)   Authorized by:   Hart Carwin MD   Signed by:   Lamona Curl CMA (AAMA) on 06/09/2010   Method used:   Electronically to        [facility] [address] #[id number]* (retail)       [address]       [place], [place]  [address]       Ph: [phone]       Fax: [phone]   RxID:   [id number] ANUSOL-HC 25 MG SUPP (HYDROCORTISONE ACETATE) Insert 1 suppository into rectum at bedtime  #12 x 3   Entered by:   Lamona Curl CMA (AAMA)   Authorized by:   Hart Carwin MD   Signed by:   Lamona Curl CMA (AAMA) on 06/09/2010   Method used:   Electronically to        [facility] [address] #[id number]* (retail)       [address]       [place], [place]  [address]       Ph: [phone]  Fax: [phone]   RxID:   [id number]

## 2010-06-29 NOTE — Assessment & Plan Note (Signed)
Summary: going through early menopause--needs to talk///sph   Vital Signs:  Patient profile:   45 year old female Height:      67 inches Weight:      145.4 pounds BMI:     22.86 Pulse rate:   76 / minute Pulse rhythm:   regular BP sitting:   112 / 74  (left arm) Cuff size:   regular  Vitals Entered By: Almeta Monas CMA Duncan Dull) (May 16, 2010 9:50 AM) CC: wants to discuss menopause   History of Present Illness: Pt here to discuss menopause.  She feels like she can't think straight or concentrate.  It is affecting her job.  She is very afraid of losing her job again.     Current Medications (verified): 1)  Levoxyl 150 Mcg Tabs (Levothyroxine Sodium) .... Take One Tablet Daily 2)  Klonopin 1 Mg Tabs (Clonazepam) .Marland Kitchen.. 1 By Mouth Two Times A Day 3)  Celexa 10 Mg Tabs (Citalopram Hydrobromide) .Marland Kitchen.. 1 By Mouth Once Daily  Allergies (verified): 1)  ! Codeine  Past History:  Past Medical History: Last updated: 04/25/2009 CHEST PAIN (ICD-786.50)     - onset May 2010     - neg gxt 02/14/2009 EXTERNAL HEMORRHOIDS WITHOUT MENTION COMP (ICD-455.3) FAMILY HISTORY DEPRESSION (ICD-V17.0) VULVITIS (ICD-616.10) CONSTIPATION (ICD-564.00) ANXIETY STATE NOS (ICD-300.00) MALAISE AND FATIGUE (ICD-780.79) TREMOR NEC (ICD-333.1) DEPRESSION (ICD-311) GRAVE'S DISEASE, HX OF (ICD-V12.2)  Past Surgical History: Last updated: 02/14/2010 "Urethral Tube Stretched" Left Foot Surgery Left Eye Surgery  Family History: Last updated: 02/14/2010 History Depression Significant for liver cancer in father who died at age 9.  Mother with HTN and High Cholesterol, she recently had three stents (age 27)   Brother with Lyme disease.  Sister healthy neg clotting disorders No FH of Colon Cancer:  Social History: Last updated: 01/27/2009 Married with 2 children (age 13, 71) Alcohol Use - no She does not drink excess caffeine Never smoked.  Risk Factors: Exercise: no (06/14/2009)  Risk  Factors: Smoking Status: never (06/14/2009)  Family History: Reviewed history from 02/14/2010 and no changes required. History Depression Significant for liver cancer in father who died at age 20.  Mother with HTN and High Cholesterol, she recently had three stents (age 26)   Brother with Lyme disease.  Sister healthy neg clotting disorders No FH of Colon Cancer:  Social History: Reviewed history from 01/27/2009 and no changes required. Married with 2 children (age 63, 29) Alcohol Use - no She does not drink excess caffeine Never smoked.  Review of Systems      See HPI  Physical Exam  General:  Well-developed,well-nourished,in no acute distress; alert,appropriate and cooperative throughout examination Lungs:  Normal respiratory effort, chest expands symmetrically. Lungs are clear to auscultation, no crackles or wheezes. Heart:  Normal rate and regular rhythm. S1 and S2 normal without gallop, murmur, click, rub or other extra sounds. Psych:  Oriented X3, normally interactive, good eye contact, easily distracted, and poor concentration.     Impression & Recommendations:  Problem # 1:  PERIMENOPAUSAL STATUS (ICD-V49.81) Pt has tried several antidepressants with no relief of symptoms celexa 10 mg 1 by mouth once daily rto 4-6 weeks or sooner prn  Problem # 2:  HYPOTHYROIDISM (ICD-244.9)  Her updated medication list for this problem includes:    Levoxyl 150 Mcg Tabs (Levothyroxine sodium) .Marland Kitchen... Take one tablet daily  Orders: Venipuncture (04540) TLB-TSH (Thyroid Stimulating Hormone) (84443-TSH) TLB-T3, Free (Triiodothyronine) (84481-T3FREE) TLB-T4 (Thyrox), Free [phone]) Specimen Handling (29562)  Labs Reviewed:  TSH: 2.00 (06/14/2009)    Chol: 168 (01/28/2009)   HDL: 43.30 (01/28/2009)   LDL: 106 (01/28/2009)   TG: 94.0 (01/28/2009)  Problem # 3:  DEPRESSION (ICD-311)  The following medications were removed from the medication list:    Paxil Cr 12.5 Mg Xr24h-tab  (Paroxetine hcl) .Marland Kitchen... 1 by mouth once daily Her updated medication list for this problem includes:    Klonopin 1 Mg Tabs (Clonazepam) .Marland Kitchen... 1 by mouth two times a day    Celexa 10 Mg Tabs (Citalopram hydrobromide) .Marland Kitchen... 1 by mouth once daily  Complete Medication List: 1)  Levoxyl 150 Mcg Tabs (Levothyroxine sodium) .... Take one tablet daily 2)  Klonopin 1 Mg Tabs (Clonazepam) .Marland Kitchen.. 1 by mouth two times a day 3)  Celexa 10 Mg Tabs (Citalopram hydrobromide) .Marland Kitchen.. 1 by mouth once daily  Other Orders: Gynecologic Referral (Gyn)  Patient Instructions: 1)  rto 4-6 weeks  Prescriptions: CELEXA 10 MG TABS (CITALOPRAM HYDROBROMIDE) 1 by mouth once daily  #30 x 2   Entered and Authorized by:   Loreen Freud DO   Signed by:   Loreen Freud DO on 05/16/2010   Method used:   Electronically to        [facility] [phone]* (retail)       [address]       [place], [place]  [address]       Ph: [phone]       Fax: [phone]   RxID:   [id number]    Orders Added: 1)  Venipuncture [36415] 2)  TLB-TSH (Thyroid Stimulating Hormone) [84443-TSH] 3)  TLB-T3, Free (Triiodothyronine) [44034-V4QVZD] 4)  TLB-T4 (Thyrox), Free [63875-IE3P] 5)  Specimen Handling [99000] 6)  Gynecologic Referral [Gyn] 7)  Est. Patient Level III [29518]

## 2010-08-22 ENCOUNTER — Telehealth: Payer: Self-pay | Admitting: Family Medicine

## 2010-08-22 NOTE — Telephone Encounter (Signed)
prozac 20 mg #30  1 po qd  2 refills

## 2010-08-22 NOTE — Telephone Encounter (Signed)
Pt states that she was giving a rx for Celexa a while back and states it didn't really work. Wants to know if she can go back to Prozac. Please advise. [facility]

## 2010-08-22 NOTE — Telephone Encounter (Signed)
Please advise Felecia Jackquline Branca CMA   

## 2010-08-23 MED ORDER — FLUOXETINE HCL 20 MG PO CAPS: 20.0000 mg | ORAL_CAPSULE | Freq: Every day | ORAL | Status: DC

## 2010-08-23 NOTE — Telephone Encounter (Signed)
VM left advising Rx ready for pick up.     KP 

## 2010-09-05 ENCOUNTER — Telehealth: Payer: Self-pay | Admitting: *Deleted

## 2010-09-05 MED ORDER — VENLAFAXINE HCL ER 37.5 MG PO CP24: ORAL_CAPSULE | ORAL | Status: DC

## 2010-09-05 NOTE — Telephone Encounter (Signed)
Patient aware of recommendations and directions on RX. Agreed to call with concerns     KP

## 2010-09-05 NOTE — Telephone Encounter (Signed)
effexor xr 37.5  #60  1 po q am for 8 days then 2 po qd  Ov 4-6 weeks

## 2010-09-05 NOTE — Telephone Encounter (Signed)
Pt left VM that she was recently changed to Prozac and it caused her to experience extreme depression, and loss of appetite. Pt would like to now try Effexor at a low dose to see if it works for her. Pt would also like to know when is the best time to take med.  Please advise

## 2010-09-11 ENCOUNTER — Telehealth: Payer: Self-pay | Admitting: *Deleted

## 2010-09-11 NOTE — Telephone Encounter (Signed)
Pt states that she recently started on Effexor and is now having difficulty with sleeping. Pt indicated that this is one of the side effect list for med but will go away with time per insertion. Pt would like to know if she should continue on med and if so is there something you can Rx to help her sleep.Please advise

## 2010-09-11 NOTE — Telephone Encounter (Signed)
Make sure she is taking it in am.  It should get better ---if not we will change it.  We can do ambien 5 mg  But I don't want her taking it every night--- every 3rd at the most ---#10.

## 2010-09-11 NOTE — Telephone Encounter (Signed)
Left message to call back     KP 

## 2010-09-12 MED ORDER — CLONAZEPAM 1 MG PO TABS: 1.0000 mg | ORAL_TABLET | Freq: Two times a day (BID) | ORAL | Status: DC | PRN

## 2010-09-12 NOTE — Telephone Encounter (Signed)
Yes she can try 1 tab klonopin at night.  Ok to refill x1

## 2010-09-12 NOTE — Telephone Encounter (Signed)
Left Pt detail message Rx sent to pharmacy and ok to increase to 1 tab.

## 2010-09-12 NOTE — Telephone Encounter (Signed)
Pt states that she is unable to take Ambien due to way it make her feel the next day. Pt has been taking Klonopin 1mg  1/2 tab to sleep now which only allows her a couple hours of sleep. Pt would like to know if she should try to take a whole tab of klonopin to see if she can sleep more if so she will need Rx for med only has a couple left.

## 2010-09-18 ENCOUNTER — Other Ambulatory Visit: Payer: Self-pay | Admitting: Family Medicine

## 2010-09-18 NOTE — Telephone Encounter (Signed)
TSH was last checked 04/2010. Refills sent.

## 2010-10-03 ENCOUNTER — Telehealth: Payer: Self-pay | Admitting: *Deleted

## 2010-10-03 DIAGNOSIS — N760 Acute vaginitis: Secondary | ICD-10-CM

## 2010-10-03 MED ORDER — SERTRALINE HCL 50 MG PO TABS: ORAL_TABLET | ORAL | Status: DC

## 2010-10-03 NOTE — Telephone Encounter (Signed)
Pt states that Effexor is not working for her. Pt is requesting to be wean off med and changed to another med like Zoloft. Pt uses [facility] [address]. Please advise

## 2010-10-03 NOTE — Telephone Encounter (Signed)
Left message to call back     KP 

## 2010-10-03 NOTE — Telephone Encounter (Signed)
Is she taking 37.5 or 75mg ?  She needs to back down to 37.5 mg daily for 1 week and stop.    Then zoloft 50 mg  1/2 po qd for 8 days then 1 po qd ---ov 4-6 weeks.

## 2010-10-03 NOTE — Telephone Encounter (Signed)
Call from patient and I made her aware of Dr.Lowne's recommendations and she agreed, already backed down to 37.5 for the last 3 days. I advised to continue for 4 days then start Zoloft 50 1/2 x's 8 days the a whole pill then f/u 4-6 weeks or sooner if needed, she agreed  to recommendation. Rx sent to pharmacy      KP

## 2010-10-24 ENCOUNTER — Telehealth: Payer: Self-pay | Admitting: *Deleted

## 2010-10-24 NOTE — Telephone Encounter (Signed)
We have she is taking zoloft 50 mg----if correct --break in half for 1-2 weeks and then she can stop.

## 2010-10-24 NOTE — Telephone Encounter (Signed)
Pt states that she would like to stop Zoloft Please advise on how Pt need to come off med.

## 2010-10-24 NOTE — Telephone Encounter (Signed)
Patient aware of Dr.Lowne's recommendations and agreed to break Zoloft 50 in half for 2 weeks and then stop, will call if any problems arise     KP

## 2010-11-10 ENCOUNTER — Other Ambulatory Visit: Payer: Self-pay | Admitting: *Deleted

## 2010-12-06 ENCOUNTER — Encounter: Payer: Self-pay | Admitting: Cardiology

## 2011-01-19 ENCOUNTER — Ambulatory Visit: Payer: Self-pay | Admitting: Family Medicine

## 2011-05-24 ENCOUNTER — Other Ambulatory Visit: Payer: Self-pay | Admitting: Family Medicine

## 2011-05-24 NOTE — Telephone Encounter (Signed)
Last seen 05/16/10. Please advise    KP

## 2011-05-24 NOTE — Telephone Encounter (Signed)
Faxed.   KP 

## 2011-05-24 NOTE — Telephone Encounter (Signed)
Ok for #30, needs appt 

## 2011-06-20 ENCOUNTER — Ambulatory Visit (INDEPENDENT_AMBULATORY_CARE_PROVIDER_SITE_OTHER): Payer: PRIVATE HEALTH INSURANCE | Admitting: Family Medicine

## 2011-06-20 ENCOUNTER — Encounter: Payer: Self-pay | Admitting: Family Medicine

## 2011-06-20 VITALS — BP 114/72 | HR 101 | Temp 98.5°F | Wt 143.4 lb

## 2011-06-20 DIAGNOSIS — R5383 Other fatigue: Secondary | ICD-10-CM

## 2011-06-20 DIAGNOSIS — F329 Major depressive disorder, single episode, unspecified: Secondary | ICD-10-CM

## 2011-06-20 LAB — BASIC METABOLIC PANEL
Calcium: 8.9 mg/dL (ref 8.4–10.5)
GFR: 75.93 mL/min (ref >60.00)
Potassium: 3.9 mEq/L (ref 3.5–5.1)
Sodium: 139 mEq/L (ref 135–145)

## 2011-06-20 LAB — CBC WITH DIFFERENTIAL/PLATELET
Basophils Absolute: 0 10*3/uL (ref 0.0–0.1)
Eosinophils Relative: 1.5 % (ref 0.0–5.0)
HCT: 38.5 % (ref 36.0–46.0)
Hemoglobin: 13.2 g/dL (ref 12.0–15.0)
Lymphocytes Relative: 32.3 % (ref 12.0–46.0)
Lymphs Abs: 1.6 10*3/uL (ref 0.7–4.0)
Monocytes Relative: 7.1 % (ref 3.0–12.0)
Neutro Abs: 2.9 10*3/uL (ref 1.4–7.7)
Platelets: 297 10*3/uL (ref 150.0–400.0)
WBC: 4.9 10*3/uL (ref 4.5–10.5)

## 2011-06-20 LAB — TSH: TSH: 0.83 u[IU]/mL (ref 0.35–5.50)

## 2011-06-20 MED ORDER — VILAZODONE HCL 10 & 20 & 40 MG PO KIT: 40.0000 mg | PACK | Freq: Every day | ORAL | Status: DC

## 2011-06-20 NOTE — Patient Instructions (Signed)
Depression      Depression is a strong emotion of feeling unhappy that can last for weeks, months, or even longer. Depression causes problems with the ability to function in life. It upsets your:   · Relationships.   · Sleep.   · Eating habits.   · Work habits.   HOME CARE  · Take all medicine as told by your doctor.   · Talk with a therapist, counselor, or friend.   · Eat a healthy diet.   · Exercise regularly.   · Do not drink alcohol or use drugs.   GET HELP RIGHT AWAY IF:  You start to have thoughts about hurting yourself or others.  MAKE SURE YOU:  · Understand these instructions.   · Will watch your condition.   · Will get help right away if you are not doing well or get worse.   Document Released: 06/16/2010 Document Revised: 01/24/2011 Document Reviewed: 06/16/2010  [employer]® Patient Information ©2012 [employer].

## 2011-06-20 NOTE — Progress Notes (Signed)
  Subjective:    Patient ID: Diamond Watts, female    DOB: 02-14-1966, 46 y.o.   MRN: [id number]  HPI  Pt here c/o increase in depression symptoms-- pt is not suicidal.  Review of Systems As above    Objective:   Physical Exam  Constitutional: She is oriented to person, place, and time. She appears well-developed and well-nourished.  Neurological: She is alert and oriented to person, place, and time.  Psychiatric: She has a normal mood and affect. Her behavior is normal. Judgment and thought content normal.          Assessment & Plan:  Depression---  Pt has had SE to many antidepressants---try Viibryd--- if problems----  Refer to psych

## 2011-07-02 ENCOUNTER — Other Ambulatory Visit: Payer: Self-pay | Admitting: Family Medicine

## 2011-07-03 ENCOUNTER — Telehealth: Payer: Self-pay

## 2011-07-03 NOTE — Telephone Encounter (Signed)
msg from patient stating she wanted to take another week of the Viibryd 20 mg, she felt like she should stay on this dose for another week and wanted to pick up a sample pack. Please advise   KP

## 2011-07-03 NOTE — Telephone Encounter (Signed)
Ok to give sample pack

## 2011-07-03 NOTE — Telephone Encounter (Signed)
VM left advising patient OK to pick up samples     KP

## 2011-08-08 ENCOUNTER — Telehealth: Payer: Self-pay | Admitting: *Deleted

## 2011-08-08 NOTE — Telephone Encounter (Signed)
msg left to call the office     KP 

## 2011-08-08 NOTE — Telephone Encounter (Signed)
Pt states that she does not feel that the vilazodone is not working at all. Pt would like to know if med can be increase or possible changed to another med that works.Please advise

## 2011-08-08 NOTE — Telephone Encounter (Signed)
she'' need  Another sample pack to wean down and then off----- ov before she is done so we can discuss changing

## 2011-08-09 NOTE — Telephone Encounter (Signed)
Please provide the directions on how you would like the patient to wean off of meds.     KP

## 2011-08-09 NOTE — Telephone Encounter (Signed)
Just follow starter pack backwards

## 2011-08-09 NOTE — Telephone Encounter (Signed)
Directions left on started pack.    KP

## 2011-09-18 ENCOUNTER — Telehealth: Payer: Self-pay | Admitting: Family Medicine

## 2011-09-18 MED ORDER — CLONAZEPAM 1 MG PO TABS: 1.0000 mg | ORAL_TABLET | Freq: Two times a day (BID) | ORAL | Status: DC | PRN

## 2011-09-18 NOTE — Telephone Encounter (Signed)
Ok for #30, no refills 

## 2011-09-18 NOTE — Telephone Encounter (Signed)
refill Clonazepam 1MG  Tab  Last OV 1.23.13 Last written 12.27.2012, qty 30  Directions TAKE 1 TABLET BY MOUTH TWICE DAILY AS NEEDED

## 2011-09-18 NOTE — Telephone Encounter (Signed)
Faxed.   KP 

## 2011-09-18 NOTE — Telephone Encounter (Signed)
Last seen 06/20/11 and filled 05/24/11 # 30. Please advise    KP

## 2011-11-07 ENCOUNTER — Encounter: Payer: Self-pay | Admitting: *Deleted

## 2011-11-27 ENCOUNTER — Ambulatory Visit: Payer: PRIVATE HEALTH INSURANCE | Admitting: Internal Medicine

## 2012-01-02 ENCOUNTER — Telehealth: Payer: Self-pay | Admitting: Family Medicine

## 2012-01-02 NOTE — Telephone Encounter (Signed)
Refill: Clonazepam 1mg  tab. Take 1 tablet by mouth 2 times a day as needed for anxiety. Qty 30. Last fill 09-18-11

## 2012-01-02 NOTE — Telephone Encounter (Signed)
Last seen 06/20/11 an filled 09/18/11 # 30. Please advise    KP

## 2012-01-02 NOTE — Telephone Encounter (Signed)
Refill x1 

## 2012-01-03 MED ORDER — CLONAZEPAM 1 MG PO TABS: 1.0000 mg | ORAL_TABLET | Freq: Two times a day (BID) | ORAL | Status: DC | PRN

## 2012-01-07 ENCOUNTER — Other Ambulatory Visit: Payer: Self-pay | Admitting: Internal Medicine

## 2012-01-07 ENCOUNTER — Encounter: Payer: Self-pay | Admitting: Gastroenterology

## 2012-01-07 MED ORDER — HYDROCORTISONE ACE-PRAMOXINE 2.5-1 % RE CREA: TOPICAL_CREAM | Freq: Three times a day (TID) | RECTAL | Status: AC

## 2012-01-07 NOTE — Telephone Encounter (Signed)
Spoke with pt and she is aware. Pt states she does not like the supp. And did not want those. Cream sent to pharmacy and pt knows to keep the OV.

## 2012-01-07 NOTE — Telephone Encounter (Signed)
LOV 05/2010- rectal itching, Anoscopy showed 1st grade hemorrhoids, she was treated successfully with Anusol HC supp #12, 1 hs, 3 refills and Anal pram cream 2.5% apply tid, #30 gm, 2 refills. Please keep OV

## 2012-01-07 NOTE — Telephone Encounter (Signed)
Patient cancelled appointment in July and rescheduled for 02/19/12 for rectal bleeding, hemorrhoids. She states about a month ago, she had rectal bleeding with a "large amount of bright red blood." States this stopped and she does not have any bleeding or pain at this time. She is having extreme rectal itching and is asking for Anusol  HC cream. Offered for patient to see an extender but she does not want to do this. States she wants to see Dr. Juanda Chance and "just wants cream to get her to the appointment." Please, advise

## 2012-01-30 ENCOUNTER — Encounter: Payer: Self-pay | Admitting: *Deleted

## 2012-02-19 ENCOUNTER — Encounter: Payer: Self-pay | Admitting: Internal Medicine

## 2012-02-19 ENCOUNTER — Ambulatory Visit (INDEPENDENT_AMBULATORY_CARE_PROVIDER_SITE_OTHER): Payer: PRIVATE HEALTH INSURANCE | Admitting: Internal Medicine

## 2012-02-19 VITALS — BP 90/64 | HR 80 | Ht 66.75 in | Wt 148.1 lb

## 2012-02-19 DIAGNOSIS — K921 Melena: Secondary | ICD-10-CM

## 2012-02-19 DIAGNOSIS — K649 Unspecified hemorrhoids: Secondary | ICD-10-CM

## 2012-02-19 MED ORDER — MOVIPREP 100 G PO SOLR: 1.0000 | Freq: Once | ORAL | Status: DC

## 2012-02-19 NOTE — Progress Notes (Signed)
Diamond Watts January 13, 1966 MRN [id number]  History of Present Illness:  This is a 46 year old white female with low-volume rectal bleeding which was previously evaluated by me in 06/09/2010. An anoscopic exam revealed first grade internal hemorrhoids which responded to topical steroids. She also complained of itching. Her brother had colon polyps. There is no family history of colorectal cancer. Her bowel habits are regular. Her rectal bleeding has continued and she is interested in a colonoscopy to rule out the possibility of colorectal neoplasia. She works at [employer]   Past Medical History  Diagnosis Date  . Chest pain     onset May 2010; neg gxt 02/14/09  . Hypothyroidism   . B12 deficiency   . Asymptomatic postmenopausal status (age-related) (natural)   . External hemorrhoids without mention of complication   . Vulvitis   . Anxiety   . Tremor     nec  . Depression   . Grave's disease     hx    Past Surgical History  Procedure Date  . Urethal tube stretched   . Foot surgery     left  . Refractive surgery     left    reports that she has never smoked. She has never used smokeless tobacco. She reports that she drinks alcohol. She reports that she does not use illicit drugs. family history includes Depression in her father; Heart disease in her mother; Hyperlipidemia in her mother; Hypertension in her mother; and Liver cancer in her father.  There is no history of Colon cancer. Allergies  Allergen Reactions  . Codeine         Review of Systems: Negative for upper GI symptoms of heartburn dysphagia or chest pain or shortness of breath  The remainder of the 10 point ROS is negative except as outlined in H&P   Physical Exam: General appearance  Well developed, in no distress. Eyes- non icteric. HEENT nontraumatic, normocephalic. Mouth no lesions, tongue papillated, no cheilosis. Neck supple without adenopathy, thyroid not enlarged, no carotid bruits, no JVD. Lungs  Clear to auscultation bilaterally. Cor normal S1, normal S2, regular rhythm, no murmur,  quiet precordium. Abdomen: Soft nontender abdomen with normal active bowel sounds. No distention. Liver edge at costal margin. No palpable mass. Rectal: Small external skin tags. Normal rectal sphincter tone. Stool is solid Hemoccult-negative. I cannot appreciate any prolapse of the hemorrhoids. Extremities no pedal edema. Skin no lesions. Neurological alert and oriented x 3. Psychological normal mood and affect.  Assessment and Plan:  Problem #1 Low volume hematochezia of several years duration treated with topical steroids. She is interested in further evaluation with a colonoscopy. Her brother had colon polyps. She is 46 years old and I feel it is reasonable to proceed with colonoscopy. We have discussed possible hemorrhoidal band ligation she had a hemorrhoidal band ligation many years ago done by a Careers adviser. But I would prefer to treat her  conservatively with topical steroids. She will stay on high-fiber diet. We have discussed colonoscopy prep as well as sedation and the procedure itself.   02/19/2012 Diamond Watts

## 2012-02-19 NOTE — Patient Instructions (Addendum)
You have been scheduled for a colonoscopy with propofol. Please follow written instructions given to you at your visit today.  Please pick up your prep kit at the pharmacy within the next 1-3 days. If you use inhalers (even only as needed), please bring them with you on the day of your procedure.  CC: Dr. Laury Axon

## 2012-02-20 ENCOUNTER — Encounter: Payer: Self-pay | Admitting: Internal Medicine

## 2012-02-20 ENCOUNTER — Telehealth: Payer: Self-pay | Admitting: Internal Medicine

## 2012-02-20 DIAGNOSIS — K921 Melena: Secondary | ICD-10-CM

## 2012-02-20 DIAGNOSIS — K649 Unspecified hemorrhoids: Secondary | ICD-10-CM

## 2012-02-20 NOTE — Telephone Encounter (Signed)
Left a message for patient to call me. 

## 2012-02-21 NOTE — Telephone Encounter (Signed)
Left a message for patient to call me. 

## 2012-02-22 NOTE — Telephone Encounter (Signed)
Left a message for patient to call me. 

## 2012-02-22 NOTE — Telephone Encounter (Signed)
Yes, we can do it.in October, [place]

## 2012-02-22 NOTE — Telephone Encounter (Signed)
Patient calling because she wants to cancel her procedure on 04/09/12 and reschedule it to 03/10/12 at [facility] endo(colon). Is this date ok with you, Dr. Juanda Chance? Does she need Propofol? Please, advise.

## 2012-02-26 NOTE — Telephone Encounter (Signed)
Left a message for patient to call me. ? [facility] appointment on 03/07/12

## 2012-02-27 NOTE — Telephone Encounter (Signed)
Patient called back and she will be calling with her days off for me today.

## 2012-02-27 NOTE — Telephone Encounter (Signed)
Left a message for patient to call me. 

## 2012-02-27 NOTE — Telephone Encounter (Signed)
Spoke with patient and she states that having her colonoscopy will need to be in December now. She is asking for it to be done on a Monday. Please, advise of a possible date.

## 2012-02-27 NOTE — Telephone Encounter (Signed)
Mondays are not possible, I reserve them for emergencies only.

## 2012-02-28 NOTE — Telephone Encounter (Signed)
Left a message to call me back

## 2012-03-04 ENCOUNTER — Encounter: Payer: Self-pay | Admitting: Internal Medicine

## 2012-03-04 NOTE — Telephone Encounter (Signed)
Scheduled colonoscopy at [facility] on 05/06/12 at 9:30/10:30 AM.

## 2012-03-07 ENCOUNTER — Other Ambulatory Visit: Payer: Self-pay | Admitting: Family Medicine

## 2012-03-07 NOTE — Telephone Encounter (Signed)
OV 02/19/12. Last filled 01/03/12 #30 no refills. Plz advise      MW

## 2012-03-18 ENCOUNTER — Telehealth: Payer: Self-pay

## 2012-03-18 DIAGNOSIS — F329 Major depressive disorder, single episode, unspecified: Secondary | ICD-10-CM

## 2012-03-18 NOTE — Telephone Encounter (Signed)
Msg from the patient stating that she is on the antidepressant and she is not really getting any better. She would like to go ahead and get the referral to Psych. Referral put in, I have left a message to assess the patient for SI/HI thoughts and intentions.      KP    Assessment & Plan:   Depression--- Pt has had SE to many antidepressants---try Viibryd--- if problems---- Refer to psych

## 2012-03-19 NOTE — Telephone Encounter (Signed)
Discussed with patient and he denied SI/HI thoughts but will call if any changes or further concerns--   KP

## 2012-04-03 ENCOUNTER — Telehealth: Payer: Self-pay

## 2012-04-03 MED ORDER — FLUOXETINE HCL 20 MG PO TABS: 20.0000 mg | ORAL_TABLET | Freq: Every day | ORAL | Status: DC

## 2012-04-03 NOTE — Telephone Encounter (Signed)
VM left advising Rx sent to the pharmacy    KP 

## 2012-04-03 NOTE — Telephone Encounter (Signed)
Ok to refill meds

## 2012-04-03 NOTE — Telephone Encounter (Signed)
Spoke with patient and she was checking the status of her referral with psych, the referral was placed a few weeks ago but no apt was scheduled. I checked with Renee and the referral goes into a separate que that we can not see due to it being behavioral health. I printed the referral and took to Luster Landsberg so she can work on it for the patient. She wanted to know if she could get a new script for Prozac 20 mg while she waits for her referral.  She uses [facility] [address]  Please advise    KP

## 2012-04-09 ENCOUNTER — Encounter: Payer: PRIVATE HEALTH INSURANCE | Admitting: Internal Medicine

## 2012-05-06 ENCOUNTER — Encounter: Payer: Self-pay | Admitting: Internal Medicine

## 2012-05-06 ENCOUNTER — Ambulatory Visit (AMBULATORY_SURGERY_CENTER): Payer: PRIVATE HEALTH INSURANCE | Admitting: Internal Medicine

## 2012-05-06 VITALS — BP 112/70 | HR 67 | Temp 98.5°F | Resp 15 | Ht 66.0 in | Wt 148.0 lb

## 2012-05-06 DIAGNOSIS — K649 Unspecified hemorrhoids: Secondary | ICD-10-CM

## 2012-05-06 DIAGNOSIS — K921 Melena: Secondary | ICD-10-CM

## 2012-05-06 DIAGNOSIS — K648 Other hemorrhoids: Secondary | ICD-10-CM

## 2012-05-06 MED ORDER — SODIUM CHLORIDE 0.9 % IV SOLN: 500.0000 mL | INTRAVENOUS | Status: DC

## 2012-05-06 MED ORDER — HYDROCORTISONE ACETATE 25 MG RE SUPP: 25.0000 mg | Freq: Two times a day (BID) | RECTAL | Status: DC

## 2012-05-06 NOTE — Progress Notes (Signed)
Patient did not experience any of the following events: a burn prior to discharge; a fall within the facility; wrong site/side/patient/procedure/implant event; or a hospital transfer or hospital admission upon discharge from the facility. [phone]) Patient did not have preoperative order for IV antibiotic SSI prophylaxis. [phone])   Pt. Has passed gas frequently while in recovery area.  Her abdomen is soft.  She states that she still feels gas discomfort. Encouraged to walk at home, lie on left side when resting to help gas to pass.

## 2012-05-06 NOTE — Op Note (Signed)
[facility] [address] [place], [address]   COLONOSCOPY PROCEDURE REPORT  PATIENT: Diamond Watts, Diamond Watts  MR#: [id number] BIRTHDATE: 1965-10-11 , 46  yrs. old GENDER: Female ENDOSCOPIST: Hart Carwin, MD REFERRED BY:  Loreen Freud, DO PROCEDURE DATE:  05/06/2012 PROCEDURE:   Colonoscopy, diagnostic ASA CLASS:   Class I INDICATIONS:hematochezia, Patient's family history of colon polyps, and brother with colon polyps, hx of hemorrhoidal banding. MEDICATIONS: MAC sedation, administered by CRNA and propofol (Diprivan) 500mg  IV  DESCRIPTION OF PROCEDURE:   After the risks and benefits and of the procedure were explained, informed consent was obtained.  A digital rectal exam revealed no abnormalities of the rectum.    The LB [id number]  endoscope was introduced through the anus and advanced to the cecum, which was identified by both the appendix and ileocecal valve .  The quality of the prep was excellent, using MoviPrep .  The instrument was then slowly withdrawn as the colon was fully examined.     COLON FINDINGS: Moderate sized internal hemorrhoids were found. Retroflexed views revealed no abnormalities.     The scope was then withdrawn from the patient and the procedure completed.  COMPLICATIONS: There were no complications. ENDOSCOPIC IMPRESSION: Moderate sized internal hemorrhoids ,see photodocumentation  RECOMMENDATIONS: Anusol HC supp 1 hs consider hemorrhoidal band ligation   REPEAT EXAM: In 10 year(s)  for Colonoscopy.  cc:  _______________________________ eSignedHart Carwin, MD 05/06/2012 10:59 AM

## 2012-05-06 NOTE — Patient Instructions (Addendum)

## 2012-05-07 ENCOUNTER — Telehealth: Payer: Self-pay | Admitting: *Deleted

## 2012-05-07 ENCOUNTER — Telehealth: Payer: Self-pay

## 2012-05-07 ENCOUNTER — Telehealth: Payer: Self-pay | Admitting: Internal Medicine

## 2012-05-07 NOTE — Telephone Encounter (Signed)
Left message on answering machine. 

## 2012-05-07 NOTE — Telephone Encounter (Signed)
Reviewed and agree.

## 2012-05-07 NOTE — Telephone Encounter (Signed)
Called patient to inform of dr.Brodies's instruction to take tylenol with in recommended dosage. Back pain not related to Colonoscopy. Patient to seek medical attention at emergency department if pain worsens or her condition worsens.

## 2012-05-07 NOTE — Telephone Encounter (Signed)
Returned patient's call. Patient stating she started this am on arrival at the [facility], with pain between her shoulder blades radiating to her chest. Patient stating it comes and goes at worst it is a 4/10. No associated nausea, vomiting and denies dyspnea.Patient denies abdominal pain and only scant amount of blood from rectum on tissue. Patient stating she has taken gas x and walking. Patient stating she thinks it is gas. Just concerned that this may not be normal. Advised patient that this may not be related to Colonoscopy on 12/10. Notified patient that I will notify Dr. Juanda Chance of above complaints. Patient instructed to drink warm fluids and if pain intensifies she need to seek medical attention at closest ED. Patient verbalized understanding. Note forwarded to Dr. Juanda Chance.

## 2012-07-14 ENCOUNTER — Other Ambulatory Visit: Payer: Self-pay | Admitting: Family Medicine

## 2012-08-20 ENCOUNTER — Other Ambulatory Visit (INDEPENDENT_AMBULATORY_CARE_PROVIDER_SITE_OTHER): Payer: PRIVATE HEALTH INSURANCE

## 2012-08-20 DIAGNOSIS — E039 Hypothyroidism, unspecified: Secondary | ICD-10-CM

## 2012-08-20 LAB — TSH: TSH: 2.44 u[IU]/mL (ref 0.35–5.50)

## 2012-08-25 ENCOUNTER — Encounter: Payer: Self-pay | Admitting: *Deleted

## 2012-08-29 ENCOUNTER — Other Ambulatory Visit: Payer: Self-pay | Admitting: Family Medicine

## 2012-08-29 DIAGNOSIS — E039 Hypothyroidism, unspecified: Secondary | ICD-10-CM

## 2012-08-29 NOTE — Telephone Encounter (Signed)
Refill for synthroid sent to [facility] outpatient pharmacy

## 2012-08-29 NOTE — Telephone Encounter (Addendum)
This patient is due for an OV with Fasting labs and she has not had her Thyroid checked in over a year. Letter mailed to schedule an apt and I have tried to call the pharmacy and cancel the Rx but the line kept going busy.    KP

## 2012-09-10 ENCOUNTER — Telehealth: Payer: Self-pay | Admitting: Family Medicine

## 2012-09-10 NOTE — Telephone Encounter (Signed)
Refill: Clonazepam 1 mg tab. Take 1 tablet by mouth 2 times a day as needed for anxiety. Qty 30. Last fill 03-07-12

## 2013-02-12 ENCOUNTER — Encounter: Payer: Self-pay | Admitting: Family Medicine

## 2013-02-12 ENCOUNTER — Ambulatory Visit (INDEPENDENT_AMBULATORY_CARE_PROVIDER_SITE_OTHER): Payer: PRIVATE HEALTH INSURANCE | Admitting: Family Medicine

## 2013-02-12 VITALS — BP 102/68 | HR 69 | Temp 98.1°F | Resp 10 | Wt 141.0 lb

## 2013-02-12 DIAGNOSIS — F329 Major depressive disorder, single episode, unspecified: Secondary | ICD-10-CM

## 2013-02-12 DIAGNOSIS — F411 Generalized anxiety disorder: Secondary | ICD-10-CM

## 2013-02-12 MED ORDER — CLONAZEPAM 1 MG PO TABS: ORAL_TABLET | ORAL | Status: DC

## 2013-02-12 MED ORDER — LEVOMILNACIPRAN HCL ER 40 MG PO CP24: 1.0000 | ORAL_CAPSULE | Freq: Every day | ORAL | Status: DC

## 2013-02-12 NOTE — Assessment & Plan Note (Addendum)
fetzima daily rto 1 month

## 2013-02-12 NOTE — Patient Instructions (Addendum)

## 2013-02-12 NOTE — Progress Notes (Signed)
  Subjective:    Patient ID: Diamond Watts, female    DOB: 06/14/1965, 47 y.o.   MRN: [id number]  HPI Pt here c/o inc depression symptoms.  She does not want to see psych-- she wants to star from square one.    Review of Systems As above    Objective:   Physical Exam BP 102/68  Pulse 69  Temp(Src) 98.1 F (36.7 C) (Oral)  Resp 10  Wt 141 lb (63.957 kg)  BMI 22.77 kg/m2  SpO2 97% General appearance: alert, cooperative, appears stated age and no distress Lungs: clear to auscultation bilaterally Heart: S1, S2 normal Psych-- no suicidal / homicidal ideation,   Normal affect        Assessment & Plan:

## 2013-04-02 ENCOUNTER — Telehealth: Payer: Self-pay | Admitting: Family Medicine

## 2013-04-02 MED ORDER — VORTIOXETINE HBR 10 MG PO TABS: 1.0000 | ORAL_TABLET | Freq: Every day | ORAL | Status: DC

## 2013-04-02 NOTE — Telephone Encounter (Signed)
Faxed. Detailed message left advising Rx faxed and note left on script to RTO in 30 days      KP

## 2013-04-02 NOTE — Telephone Encounter (Signed)
Please advise      KP 

## 2013-04-02 NOTE — Telephone Encounter (Signed)
Patient states that her medication Levomilnacipran HCl ER (FETZIMA) 40 MG is not working and she would like to know if something else can be prescribed. Please advise.

## 2013-04-02 NOTE — Telephone Encounter (Signed)
brintellix 10 mg #30  1 po qd --- can give samples----ov in 1 month

## 2013-04-03 ENCOUNTER — Other Ambulatory Visit: Payer: Self-pay | Admitting: Family Medicine

## 2013-05-14 ENCOUNTER — Encounter: Payer: Self-pay | Admitting: Lab

## 2013-05-15 ENCOUNTER — Ambulatory Visit (INDEPENDENT_AMBULATORY_CARE_PROVIDER_SITE_OTHER): Payer: PRIVATE HEALTH INSURANCE | Admitting: Family Medicine

## 2013-05-15 ENCOUNTER — Encounter: Payer: Self-pay | Admitting: Family Medicine

## 2013-05-15 VITALS — BP 100/60 | HR 81 | Temp 98.1°F | Wt 142.4 lb

## 2013-05-15 DIAGNOSIS — Z0000 Encounter for general adult medical examination without abnormal findings: Secondary | ICD-10-CM

## 2013-05-15 DIAGNOSIS — F329 Major depressive disorder, single episode, unspecified: Secondary | ICD-10-CM

## 2013-05-15 MED ORDER — VORTIOXETINE HBR 20 MG PO TABS: ORAL_TABLET | ORAL | Status: DC

## 2013-05-15 NOTE — Progress Notes (Signed)
Pre visit review using our clinic review tool, if applicable. No additional management support is needed unless otherwise documented below in the visit note. 

## 2013-05-15 NOTE — Assessment & Plan Note (Signed)
Increase Brintellix 20 mg 1 po qd  rto 3 months or sooner prn

## 2013-05-15 NOTE — Progress Notes (Signed)
   Subjective:    Patient ID: Diamond Watts, female    DOB: [birth year], 47 y.o.   MRN: [id number]  HPI Pt here f/u depression.   Pt feels a little better but feels like her mood could improve some more.    Review of Systems As above    Objective:   Physical Exam BP 100/60  Pulse 81  Temp(Src) 98.1 F (36.7 C) (Oral)  Wt 142 lb 6.4 oz (64.592 kg)  SpO2 97% General appearance: alert, cooperative, appears stated age and no distress Neurologic: Grossly normal Psych- no depression, no anxiety,  Not suicidal       Assessment & Plan:

## 2013-05-15 NOTE — Patient Instructions (Signed)

## 2013-06-08 ENCOUNTER — Encounter: Payer: Self-pay | Admitting: Lab

## 2013-06-08 ENCOUNTER — Other Ambulatory Visit (INDEPENDENT_AMBULATORY_CARE_PROVIDER_SITE_OTHER): Payer: PRIVATE HEALTH INSURANCE

## 2013-06-08 DIAGNOSIS — F3289 Other specified depressive episodes: Secondary | ICD-10-CM

## 2013-06-08 DIAGNOSIS — Z0000 Encounter for general adult medical examination without abnormal findings: Secondary | ICD-10-CM

## 2013-06-08 DIAGNOSIS — F32A Depression, unspecified: Secondary | ICD-10-CM

## 2013-06-08 DIAGNOSIS — F329 Major depressive disorder, single episode, unspecified: Secondary | ICD-10-CM

## 2013-06-08 LAB — CBC WITH DIFFERENTIAL/PLATELET
BASOS ABS: 0 10*3/uL (ref 0.0–0.1)
Basophils Relative: 0.6 % (ref 0.0–3.0)
EOS ABS: 0.1 10*3/uL (ref 0.0–0.7)
Eosinophils Relative: 1.5 % (ref 0.0–5.0)
HEMATOCRIT: 39.3 % (ref 36.0–46.0)
HEMOGLOBIN: 13.1 g/dL (ref 12.0–15.0)
LYMPHS ABS: 2.1 10*3/uL (ref 0.7–4.0)
Lymphocytes Relative: 35.2 % (ref 12.0–46.0)
MCHC: 33.2 g/dL (ref 30.0–36.0)
MCV: 93.1 fl (ref 78.0–100.0)
MONO ABS: 0.4 10*3/uL (ref 0.1–1.0)
Monocytes Relative: 7.3 % (ref 3.0–12.0)
NEUTROS ABS: 3.3 10*3/uL (ref 1.4–7.7)
Neutrophils Relative %: 55.4 % (ref 43.0–77.0)
PLATELETS: 335 10*3/uL (ref 150.0–400.0)
RBC: 4.22 Mil/uL (ref 3.87–5.11)
RDW: 14.1 % (ref 11.5–14.6)
WBC: 6 10*3/uL (ref 4.5–10.5)

## 2013-06-08 LAB — LIPID PANEL
CHOL/HDL RATIO: 4
Cholesterol: 257 mg/dL — ABNORMAL HIGH (ref 0–200)
HDL: 71.5 mg/dL (ref >39.00)
Triglycerides: 104 mg/dL (ref 0.0–149.0)
VLDL: 20.8 mg/dL (ref 0.0–40.0)

## 2013-06-08 LAB — HEPATIC FUNCTION PANEL
ALT: 17 U/L (ref 0–35)
AST: 15 U/L (ref 0–37)
Albumin: 4.1 g/dL (ref 3.5–5.2)
Alkaline Phosphatase: 46 U/L (ref 39–117)
BILIRUBIN DIRECT: 0 mg/dL (ref 0.0–0.3)
Total Bilirubin: 0.8 mg/dL (ref 0.3–1.2)
Total Protein: 7.3 g/dL (ref 6.0–8.3)

## 2013-06-08 LAB — BASIC METABOLIC PANEL
BUN: 14 mg/dL (ref 6–23)
CO2: 27 meq/L (ref 19–32)
Calcium: 9 mg/dL (ref 8.4–10.5)
Chloride: 107 mEq/L (ref 96–112)
Creatinine, Ser: 0.8 mg/dL (ref 0.4–1.2)
GFR: 80.77 mL/min (ref >60.00)
GLUCOSE: 94 mg/dL (ref 70–99)
POTASSIUM: 4.2 meq/L (ref 3.5–5.1)
SODIUM: 140 meq/L (ref 135–145)

## 2013-06-08 LAB — LDL CHOLESTEROL, DIRECT: LDL DIRECT: 151.2 mg/dL

## 2013-06-08 LAB — VITAMIN B12: Vitamin B-12: 345 pg/mL (ref 211–911)

## 2013-06-08 LAB — TSH: TSH: 0.33 u[IU]/mL — AB (ref 0.35–5.50)

## 2013-06-12 MED ORDER — LEVOTHYROXINE SODIUM 137 MCG PO TABS
ORAL_TABLET | ORAL | Status: DC
Start: 2013-06-12 — End: 2014-02-02

## 2013-07-01 ENCOUNTER — Telehealth: Payer: Self-pay | Admitting: *Deleted

## 2013-07-01 NOTE — Telephone Encounter (Signed)
She has been on several meds-- back down to 10 mg and give her list of names of psych

## 2013-07-01 NOTE — Telephone Encounter (Signed)
Left message on voice mail with instructions to decrease Brintellix to 10mg . Also advised that Dr. Laury AxonLowne recommends to f/u with psych for med mgmt.

## 2013-07-01 NOTE — Telephone Encounter (Signed)
Patient called and stated that Brintellix is not working for her. She states that it helps her focus, but makes her mood worse. Please advise. JG//CMA

## 2013-07-02 ENCOUNTER — Encounter: Payer: Self-pay | Admitting: *Deleted

## 2013-07-02 NOTE — Telephone Encounter (Signed)
Letter sent to pt with names of psych.

## 2013-08-10 ENCOUNTER — Other Ambulatory Visit: Payer: PRIVATE HEALTH INSURANCE

## 2013-08-12 ENCOUNTER — Other Ambulatory Visit (INDEPENDENT_AMBULATORY_CARE_PROVIDER_SITE_OTHER): Payer: PRIVATE HEALTH INSURANCE

## 2013-08-12 DIAGNOSIS — E039 Hypothyroidism, unspecified: Secondary | ICD-10-CM

## 2013-08-12 LAB — TSH: TSH: 0.2 u[IU]/mL — ABNORMAL LOW (ref 0.35–5.50)

## 2013-08-19 ENCOUNTER — Telehealth: Payer: Self-pay | Admitting: *Deleted

## 2013-08-19 DIAGNOSIS — E0590 Thyrotoxicosis, unspecified without thyrotoxic crisis or storm: Secondary | ICD-10-CM

## 2013-08-19 MED ORDER — LEVOTHYROXINE SODIUM 125 MCG PO TABS: 125.0000 ug | ORAL_TABLET | Freq: Every day | ORAL | Status: DC

## 2013-08-19 NOTE — Telephone Encounter (Signed)
Message copied by Baldwin JamaicaJOHNSON, Calvin Chura G on Wed Aug 19, 2013  3:20 PM ------      Message from: Lelon PerlaLOWNE, YVONNE R      Created: Thu Aug 13, 2013  1:19 PM       Hyperthyroid--- decrease synthroid to 125 mcg #30 1 po qd , 2 refills   Recheck 2 months  244.9  TSH ------

## 2013-08-19 NOTE — Telephone Encounter (Signed)
Left message on voice mail to return our call, copy of labs mailed to pt and Rx for synthroid 125 mcg sent to pts pharmacy

## 2013-09-08 ENCOUNTER — Encounter: Payer: Self-pay | Admitting: Family Medicine

## 2013-09-10 ENCOUNTER — Telehealth: Payer: Self-pay | Admitting: Family Medicine

## 2013-09-10 DIAGNOSIS — F329 Major depressive disorder, single episode, unspecified: Secondary | ICD-10-CM

## 2013-09-10 DIAGNOSIS — F32A Depression, unspecified: Secondary | ICD-10-CM

## 2013-09-10 MED ORDER — VORTIOXETINE HBR 20 MG PO TABS: 10.0000 mg | ORAL_TABLET | Freq: Every day | ORAL | Status: DC

## 2013-09-10 NOTE — Telephone Encounter (Signed)
Reviewed pts chart and providers notes from 05/2013. Patient advised to start Brintellix at 10mg  dose and to follow up with psych. Left message on machine with those instructions, advsied to call the office if she needed another list of providers mailed to her. Rx for #30 and no refills sent to [facility] pharmacy.

## 2013-09-10 NOTE — Telephone Encounter (Signed)
Patient called stating she may want to restart medication Brintellix 10mg . She would like to discuss this with someone. [facility] uses [facility] pharmacy.

## 2013-09-11 ENCOUNTER — Telehealth: Payer: Self-pay | Admitting: Family Medicine

## 2013-09-11 NOTE — Telephone Encounter (Signed)
Left message on voice mail that coupon is up front that she is requesting.

## 2013-09-11 NOTE — Telephone Encounter (Signed)
A user error has taken place.

## 2013-09-11 NOTE — Telephone Encounter (Signed)
4.17.15  Pt is picking up there RX for Vortioxetine HBr (BRINTELLIX) 20 MG today at their pharmacy and they forgot to get the coupon from us during the visit.  Pt would like to pick the coupon up today, 4.17.15, before 12 noon.  Please advise.

## 2013-09-15 ENCOUNTER — Other Ambulatory Visit: Payer: Self-pay | Admitting: *Deleted

## 2013-09-15 DIAGNOSIS — F329 Major depressive disorder, single episode, unspecified: Secondary | ICD-10-CM

## 2013-09-15 DIAGNOSIS — F32A Depression, unspecified: Secondary | ICD-10-CM

## 2013-09-15 MED ORDER — VORTIOXETINE HBR 10 MG PO TABS: 1.0000 | ORAL_TABLET | Freq: Every day | ORAL | Status: DC

## 2013-09-15 NOTE — Telephone Encounter (Signed)
Spoke with patient who stated that she wanted Brintellix in the 10 mg tablet. This was sent to [facility] pharmacy

## 2014-01-18 ENCOUNTER — Telehealth: Payer: Self-pay | Admitting: Family Medicine

## 2014-01-18 NOTE — Telephone Encounter (Signed)
Pt scheduled a lab appointment for 01/21/14 regarding her thyroid and  Cholesterol check is this ok?

## 2014-01-18 NOTE — Telephone Encounter (Signed)
She needs an OV with Dr.Lowne. Please schedule.     KP

## 2014-01-21 ENCOUNTER — Other Ambulatory Visit: Payer: PRIVATE HEALTH INSURANCE

## 2014-02-02 ENCOUNTER — Encounter: Payer: Self-pay | Admitting: Family Medicine

## 2014-02-02 ENCOUNTER — Ambulatory Visit (INDEPENDENT_AMBULATORY_CARE_PROVIDER_SITE_OTHER): Payer: PRIVATE HEALTH INSURANCE | Admitting: Family Medicine

## 2014-02-02 VITALS — BP 130/80 | HR 70 | Temp 98.3°F | Wt 136.5 lb

## 2014-02-02 DIAGNOSIS — E785 Hyperlipidemia, unspecified: Secondary | ICD-10-CM

## 2014-02-02 DIAGNOSIS — S161XXA Strain of muscle, fascia and tendon at neck level, initial encounter: Secondary | ICD-10-CM

## 2014-02-02 DIAGNOSIS — S139XXA Sprain of joints and ligaments of unspecified parts of neck, initial encounter: Secondary | ICD-10-CM

## 2014-02-02 DIAGNOSIS — Z23 Encounter for immunization: Secondary | ICD-10-CM

## 2014-02-02 DIAGNOSIS — E039 Hypothyroidism, unspecified: Secondary | ICD-10-CM

## 2014-02-02 LAB — T3, FREE: T3, Free: 2.6 pg/mL (ref 2.3–4.2)

## 2014-02-02 LAB — LIPID PANEL
CHOL/HDL RATIO: 4
Cholesterol: 230 mg/dL — ABNORMAL HIGH (ref 0–200)
HDL: 65.4 mg/dL (ref >39.00)
LDL Cholesterol: 142 mg/dL — ABNORMAL HIGH (ref 0–99)
NONHDL: 164.6
Triglycerides: 111 mg/dL (ref 0.0–149.0)
VLDL: 22.2 mg/dL (ref 0.0–40.0)

## 2014-02-02 LAB — HEPATIC FUNCTION PANEL
ALK PHOS: 41 U/L (ref 39–117)
ALT: 15 U/L (ref 0–35)
AST: 19 U/L (ref 0–37)
Albumin: 3.9 g/dL (ref 3.5–5.2)
BILIRUBIN DIRECT: 0 mg/dL (ref 0.0–0.3)
BILIRUBIN TOTAL: 0.7 mg/dL (ref 0.2–1.2)
Total Protein: 7.4 g/dL (ref 6.0–8.3)

## 2014-02-02 LAB — T4, FREE: FREE T4: 0.98 ng/dL (ref 0.60–1.60)

## 2014-02-02 LAB — TSH: TSH: 0.23 u[IU]/mL — ABNORMAL LOW (ref 0.35–4.50)

## 2014-02-02 NOTE — Patient Instructions (Signed)
Hypothyroidism The thyroid is a large gland located in the lower front of your neck. The thyroid gland helps control metabolism. Metabolism is how your body handles food. It controls metabolism with the hormone thyroxine. When this gland is underactive (hypothyroid), it produces too little hormone.  CAUSES These include:   Absence or destruction of thyroid tissue.  Goiter due to iodine deficiency.  Goiter due to medications.  Congenital defects (since birth).  Problems with the pituitary. This causes a lack of TSH (thyroid stimulating hormone). This hormone tells the thyroid to turn out more hormone. SYMPTOMS  Lethargy (feeling as though you have no energy)  Cold intolerance  Weight gain (in spite of normal food intake)  Dry skin  Coarse hair  Menstrual irregularity (if severe, may lead to infertility)  Slowing of thought processes Cardiac problems are also caused by insufficient amounts of thyroid hormone. Hypothyroidism in the newborn is cretinism, and is an extreme form. It is important that this form be treated adequately and immediately or it will lead rapidly to retarded physical and mental development. DIAGNOSIS  To prove hypothyroidism, your caregiver may do blood tests and ultrasound tests. Sometimes the signs are hidden. It may be necessary for your caregiver to watch this illness with blood tests either before or after diagnosis and treatment. TREATMENT  Low levels of thyroid hormone are increased by using synthetic thyroid hormone. This is a safe, effective treatment. It usually takes about four weeks to gain the full effects of the medication. After you have the full effect of the medication, it will generally take another four weeks for problems to leave. Your caregiver may start you on low doses. If you have had heart problems the dose may be gradually increased. It is generally not an emergency to get rapidly to normal. HOME CARE INSTRUCTIONS   Take your  medications as your caregiver suggests. Let your caregiver know of any medications you are taking or start taking. Your caregiver will help you with dosage schedules.  As your condition improves, your dosage needs may increase. It will be necessary to have continuing blood tests as suggested by your caregiver.  Report all suspected medication side effects to your caregiver. SEEK MEDICAL CARE IF: Seek medical care if you develop:  Sweating.  Tremulousness (tremors).  Anxiety.  Rapid weight loss.  Heat intolerance.  Emotional swings.  Diarrhea.  Weakness. SEEK IMMEDIATE MEDICAL CARE IF:  You develop chest pain, an irregular heart beat (palpitations), or a rapid heart beat. MAKE SURE YOU:   Understand these instructions.  Will watch your condition.  Will get help right away if you are not doing well or get worse. Document Released: 05/14/2005 Document Revised: 08/06/2011 Document Reviewed: 01/02/2008 ExitCare Patient Information 2015 [employer]. This information is not intended to replace advice given to you by your health care provider. Make sure you discuss any questions you have with your health care provider.  

## 2014-02-02 NOTE — Progress Notes (Signed)
   Subjective:    Patient ID: Diamond Watts, female    DOB: [birth year], 48 y.o.   MRN: [id number]  HPI Pt here for f/u and labs.   Her only complaint is neck strain the radiates to chest on occasion.  It occurs esp when she lifts bag up over shoulder.  No known injury.  No sob or palpitations.     Review of Systems As above     Objective:   Physical Exam BP 130/80  Pulse 70  Temp(Src) 98.3 F (36.8 C) (Oral)  Wt 136 lb 7.4 oz (61.9 kg)  SpO2 100% General appearance: alert, cooperative, appears stated age and no distress Neck: no adenopathy, supple, symmetrical, trachea midline and thyroid not enlarged, symmetric, no tenderness/mass/nodules Lungs: clear to auscultation bilaterally Heart: S1, S2 normal Extremities: extremities normal, atraumatic, no cyanosis or edema       Assessment & Plan:  1. Unspecified hypothyroidism Check labs today - TSH - T3, free - T4, free  2. Other and unspecified hyperlipidemia Check labs - Hepatic function panel - Lipid panel  3. Neck strain, initial encounter Massage,  Alt heat and ice Chiropractor rto prn

## 2014-02-02 NOTE — Progress Notes (Signed)
Pre visit review using our clinic review tool, if applicable. No additional management support is needed unless otherwise documented below in the visit note. 

## 2014-02-03 ENCOUNTER — Other Ambulatory Visit: Payer: Self-pay

## 2014-02-03 DIAGNOSIS — E0590 Thyrotoxicosis, unspecified without thyrotoxic crisis or storm: Secondary | ICD-10-CM

## 2014-02-03 MED ORDER — LEVOTHYROXINE SODIUM 112 MCG PO TABS: 112.0000 ug | ORAL_TABLET | Freq: Every day | ORAL | Status: DC

## 2014-04-05 ENCOUNTER — Other Ambulatory Visit: Payer: Self-pay | Admitting: Family Medicine

## 2014-05-18 ENCOUNTER — Other Ambulatory Visit: Payer: Self-pay | Admitting: Family Medicine

## 2014-05-18 ENCOUNTER — Telehealth: Payer: Self-pay | Admitting: Family Medicine

## 2014-05-18 ENCOUNTER — Other Ambulatory Visit: Payer: Self-pay

## 2014-05-18 DIAGNOSIS — E039 Hypothyroidism, unspecified: Secondary | ICD-10-CM

## 2014-05-18 MED ORDER — LEVOTHYROXINE SODIUM 112 MCG PO TABS: ORAL_TABLET | ORAL | Status: DC

## 2014-05-18 NOTE — Telephone Encounter (Signed)
Pt called back and scheduled lab appt for 05/31/14 at 10:30am.

## 2014-05-18 NOTE — Telephone Encounter (Signed)
Caller name: Buzzy HanDuvall, Lundon J Relation to pt: self  Call back number: [phone] Pharmacy:  [id number] - [place], [place] - [address] [facility] [address] [phone] (Phone)     Reason for call:  Pt is requesting a refill levothyroxine (SYNTHROID, LEVOTHROID) 112 MCG tablet, pt stated she will make an appointment after the holidays.

## 2014-05-18 NOTE — Telephone Encounter (Signed)
30 day supply of levothyroxine sent to pharmacy. Pt was due for recheck of thyroid in November and is past due. She needs to schedule lab appt before further refills can be given. FUture lab order entered.

## 2014-05-25 ENCOUNTER — Other Ambulatory Visit (INDEPENDENT_AMBULATORY_CARE_PROVIDER_SITE_OTHER): Payer: PRIVATE HEALTH INSURANCE

## 2014-05-25 DIAGNOSIS — E039 Hypothyroidism, unspecified: Secondary | ICD-10-CM

## 2014-05-25 LAB — TSH: TSH: 4.62 u[IU]/mL — ABNORMAL HIGH (ref 0.35–4.50)

## 2014-05-31 ENCOUNTER — Other Ambulatory Visit: Payer: PRIVATE HEALTH INSURANCE

## 2014-06-02 ENCOUNTER — Telehealth: Payer: Self-pay

## 2014-06-02 MED ORDER — LEVOTHYROXINE SODIUM 100 MCG PO TABS: ORAL_TABLET | ORAL | Status: DC

## 2014-06-02 NOTE — Telephone Encounter (Signed)
Discussed with patient and she voiced understanding, she has agreed to the new regimen of Levothyroxine 100 mg 1 1/2 tab mon and wed and 1 tab all other days. Rx sent to the pharmacy requested.     KP

## 2014-06-02 NOTE — Telephone Encounter (Signed)
-----   Message from Lelon PerlaYvonne R Lowne, DO sent at 05/27/2014  2:01 PM EST ----- Hypothyroid--- she was on 125 mcg before ,  Now on 112mc----- there is no inbetween dose---- need to alt between doses Synthroid 100 mcg  1 1/2 tab mon and wed and 1 tab all other days----recheck 2 months TSH, free t3, free t4  244,.9

## 2014-07-26 ENCOUNTER — Other Ambulatory Visit: Payer: Self-pay | Admitting: Family Medicine

## 2014-07-26 NOTE — Telephone Encounter (Signed)
Last seen 02/02/14 and filled 02/12/13 #-60 with 1 refill. UDS 06/08/13 low risk   Please advise     KP

## 2014-08-05 ENCOUNTER — Telehealth: Payer: Self-pay | Admitting: Family Medicine

## 2014-08-05 DIAGNOSIS — E038 Other specified hypothyroidism: Secondary | ICD-10-CM

## 2014-08-05 DIAGNOSIS — R5383 Other fatigue: Secondary | ICD-10-CM

## 2014-08-05 NOTE — Telephone Encounter (Signed)
Caller name: Fabio AsaDuvall, Jailyne J Relation to pt: SELF  Call back number: [phone]   Reason for call:   Pt states she would like to schedule a B12 or have labs done to check her thyroid due to patient feeling tired. Please advise

## 2014-08-05 NOTE — Telephone Encounter (Signed)
Notes Recorded by Lelon PerlaYvonne R Lowne, DO on 05/27/2014 at 2:01 PM Hypothyroid--- she was on 125 mcg before , Now on 112mc----- there is no inbetween dose---- need to alt between doses Synthroid 100 mcg 1 1/2 tab mon and wed and 1 tab all other days----recheck 2 months TSH, free t3, free t4 244,.9  Per lab note above, pt is due for labs.  Lab appointment scheduled for 08/09/14 at 9:15 am.  Future labs ordered (TSH, free t3, free t4).  Pt is also requesting a Vitamin b12 level be added.    Please advise.

## 2014-08-09 ENCOUNTER — Other Ambulatory Visit (INDEPENDENT_AMBULATORY_CARE_PROVIDER_SITE_OTHER): Payer: PRIVATE HEALTH INSURANCE

## 2014-08-09 DIAGNOSIS — R5383 Other fatigue: Secondary | ICD-10-CM

## 2014-08-09 DIAGNOSIS — E038 Other specified hypothyroidism: Secondary | ICD-10-CM

## 2014-08-09 LAB — TSH: TSH: 0.22 u[IU]/mL — ABNORMAL LOW (ref 0.35–4.50)

## 2014-08-09 LAB — T3, FREE: T3 FREE: 3.3 pg/mL (ref 2.3–4.2)

## 2014-08-09 LAB — T4, FREE: Free T4: 1.31 ng/dL (ref 0.60–1.60)

## 2014-08-09 LAB — VITAMIN B12: Vitamin B-12: 268 pg/mL (ref 211–911)

## 2014-08-09 NOTE — Telephone Encounter (Signed)
Order in and Ford CliffSheena will add.     KP

## 2014-08-09 NOTE — Telephone Encounter (Signed)
Ok to add b12 to TSH---dx fatigue

## 2014-08-11 ENCOUNTER — Telehealth: Payer: Self-pay | Admitting: Family Medicine

## 2014-08-11 ENCOUNTER — Telehealth: Payer: Self-pay

## 2014-08-11 MED ORDER — LEVOTHYROXINE SODIUM 88 MCG PO TABS: ORAL_TABLET | ORAL | Status: DC

## 2014-08-11 NOTE — Telephone Encounter (Signed)
Caller name: Fabio AsaDuvall, Collyns J Relation to pt: self  Call back number: [phone]   Reason for call:   Pt requesting office notes regarding MD treating thyroid please fax to  [facility] attention [facility]  [facility] contact information phone #[phone] and fax #[phone].

## 2014-08-11 NOTE — Telephone Encounter (Signed)
Spoke with patient and the records have been faxed.     KP

## 2014-08-11 NOTE — Telephone Encounter (Signed)
-----   Message from Lelon PerlaYvonne R Lowne, DO sent at 08/10/2014  8:26 AM EDT ----- Hyperthyroid--- dec synthroid to 88 mg daily --- recheck 2 months TSH--- dx hypothyroid b12 low normal--- may benefit from b12 injections weekly x4 then monthly--- recheck b12 in 1 month

## 2014-08-11 NOTE — Telephone Encounter (Signed)
Discussed with patient and she verbalized understanding, she has agreed to decrease to 88 mcg and the Rx has been faxed. She will come in on Tuesday of next week for her first B-12.     KP

## 2014-08-17 ENCOUNTER — Ambulatory Visit: Payer: PRIVATE HEALTH INSURANCE

## 2014-09-06 HISTORY — PX: OTHER SURGICAL HISTORY: SHX169

## 2014-09-30 ENCOUNTER — Telehealth: Payer: Self-pay | Admitting: Family Medicine

## 2014-09-30 DIAGNOSIS — E039 Hypothyroidism, unspecified: Secondary | ICD-10-CM

## 2014-09-30 DIAGNOSIS — E538 Deficiency of other specified B group vitamins: Secondary | ICD-10-CM

## 2014-09-30 NOTE — Telephone Encounter (Signed)
Pt would like to schedule B12 please advise

## 2014-09-30 NOTE — Telephone Encounter (Signed)
Pt stated she had lab work done at [facility]in[facility] [phone]. As per endo please fax over letter head exactly what labs PCP is looking for. Endo Medical Records fax is [phone]

## 2014-09-30 NOTE — Telephone Encounter (Signed)
Patient needs repeat labs, the order is in.      Diamond Watts

## 2014-10-05 ENCOUNTER — Ambulatory Visit (INDEPENDENT_AMBULATORY_CARE_PROVIDER_SITE_OTHER): Payer: PRIVATE HEALTH INSURANCE | Admitting: Family Medicine

## 2014-10-05 ENCOUNTER — Encounter: Payer: Self-pay | Admitting: Family Medicine

## 2014-10-05 VITALS — BP 122/86 | HR 66 | Temp 98.3°F | Wt 136.8 lb

## 2014-10-05 DIAGNOSIS — R079 Chest pain, unspecified: Secondary | ICD-10-CM

## 2014-10-05 DIAGNOSIS — R5383 Other fatigue: Secondary | ICD-10-CM | POA: Diagnosis not present

## 2014-10-05 MED ORDER — KETOROLAC TROMETHAMINE 60 MG/2ML IM SOLN
60.0000 mg | Freq: Once | INTRAMUSCULAR | Status: AC
  Administered 2014-10-05: 60 mg via INTRAMUSCULAR

## 2014-10-05 MED ORDER — CYANOCOBALAMIN 1000 MCG/ML IJ SOLN
1000.0000 ug | Freq: Once | INTRAMUSCULAR | Status: AC
  Administered 2014-10-05: 1000 ug via INTRAMUSCULAR

## 2014-10-05 MED ORDER — MELOXICAM 15 MG PO TABS: 15.0000 mg | ORAL_TABLET | Freq: Every day | ORAL | Status: DC

## 2014-10-05 NOTE — Patient Instructions (Signed)
Costochondritis Costochondritis, sometimes called Tietze syndrome, is a swelling and irritation (inflammation) of the tissue (cartilage) that connects your ribs with your breastbone (sternum). It causes pain in the chest and rib area. Costochondritis usually goes away on its own over time. It can take up to 6 weeks or longer to get better, especially if you are unable to limit your activities. CAUSES  Some cases of costochondritis have no known cause. Possible causes include:  Injury (trauma).  Exercise or activity such as lifting.  Severe coughing. SIGNS AND SYMPTOMS  Pain and tenderness in the chest and rib area.  Pain that gets worse when coughing or taking deep breaths.  Pain that gets worse with specific movements. DIAGNOSIS  Your health care provider will do a physical exam and ask about your symptoms. Chest X-rays or other tests may be done to rule out other problems. TREATMENT  Costochondritis usually goes away on its own over time. Your health care provider may prescribe medicine to help relieve pain. HOME CARE INSTRUCTIONS   Avoid exhausting physical activity. Try not to strain your ribs during normal activity. This would include any activities using chest, abdominal, and side muscles, especially if heavy weights are used.  Apply ice to the affected area for the first 2 days after the pain begins.  Put ice in a plastic bag.  Place a towel between your skin and the bag.  Leave the ice on for 20 minutes, 2-3 times a day.  Only take over-the-counter or prescription medicines as directed by your health care provider. SEEK MEDICAL CARE IF:  You have redness or swelling at the rib joints. These are signs of infection.  Your pain does not go away despite rest or medicine. SEEK IMMEDIATE MEDICAL CARE IF:   Your pain increases or you are very uncomfortable.  You have shortness of breath or difficulty breathing.  You cough up blood.  You have worse chest pains,  sweating, or vomiting.  You have a fever or persistent symptoms for more than 2-3 days.  You have a fever and your symptoms suddenly get worse. MAKE SURE YOU:   Understand these instructions.  Will watch your condition.  Will get help right away if you are not doing well or get worse. Document Released: 02/21/2005 Document Revised: 03/04/2013 Document Reviewed: 12/16/2012 ExitCare Patient Information 2015 [employer]. This information is not intended to replace advice given to you by your health care provider. Make sure you discuss any questions you have with your health care provider.  

## 2014-10-05 NOTE — Progress Notes (Signed)
Pre visit review using our clinic review tool, if applicable. No additional management support is needed unless otherwise documented below in the visit note. 

## 2014-10-05 NOTE — Progress Notes (Signed)
Patient ID: Diamond Watts, female    DOB: 09/28/1965  Age: 49 y.o. MRN: [id number]    Subjective:  Subjective HPI Diamond Watts presents for chest pain-- she thinks its a pulled muscle but is concerned about PE. She just had abdominoplasty 09/05/2104.  Review of Systems  Constitutional: Negative for diaphoresis, appetite change, fatigue and unexpected weight change.  Eyes: Negative for pain, redness and visual disturbance.  Respiratory: Negative for cough, chest tightness, shortness of breath and wheezing.   Cardiovascular: Negative for chest pain, palpitations and leg swelling.  Endocrine: Negative for cold intolerance, heat intolerance, polydipsia, polyphagia and polyuria.  Genitourinary: Negative for dysuria, frequency and difficulty urinating.  Neurological: Negative for dizziness, light-headedness, numbness and headaches.    History Past Medical History  Diagnosis Date  . Chest pain     onset May 2010; neg gxt 02/14/09  . Hypothyroidism   . B12 deficiency   . Asymptomatic postmenopausal status (age-related) (natural)   . External hemorrhoids without mention of complication   . Vulvitis   . Anxiety   . Tremor     nec  . Depression   . Grave's disease     hx     She has past surgical history that includes urethal tube stretched; Foot surgery; Refractive surgery; abdominoplasty (09/06/2014); and mastoplexy (Bilateral, 09/06/2014).   Her family history includes Depression in her father; Heart disease in her mother; Hyperlipidemia in her mother; Hypertension in her mother; Liver cancer in her father. There is no history of Colon cancer.She reports that she has never smoked. She has never used smokeless tobacco. She reports that she drinks alcohol. She reports that she does not use illicit drugs.  Current Outpatient Prescriptions on File Prior to Visit  Medication Sig Dispense Refill  . clonazePAM (KLONOPIN) 1 MG tablet TAKE 1 TABLET BY MOUTH 2 TIMES A DAY AS NEEDED FOR ANXIETY  60 tablet 1   No current facility-administered medications on file prior to visit.     Objective:  Objective Physical Exam  Constitutional: She is oriented to person, place, and time. She appears well-developed and well-nourished.  HENT:  Head: Normocephalic and atraumatic.  Eyes: Conjunctivae and EOM are normal.  Neck: Normal range of motion. Neck supple. No JVD present. Carotid bruit is not present. No thyromegaly present.  Cardiovascular: Normal rate, regular rhythm and normal heart sounds.   No murmur heard. Pulmonary/Chest: Effort normal and breath sounds normal. No respiratory distress. She has no wheezes. She has no rales. She exhibits no tenderness.  Musculoskeletal: She exhibits no edema.  Neurological: She is alert and oriented to person, place, and time.  Psychiatric: She has a normal mood and affect.   BP 122/86 mmHg  Pulse 66  Temp(Src) 98.3 F (36.8 C) (Oral)  Wt 136 lb 12.8 oz (62.052 kg)  SpO2 99% Wt Readings from Last 3 Encounters:  10/05/14 136 lb 12.8 oz (62.052 kg)  02/02/14 136 lb 7.4 oz (61.9 kg)  05/15/13 142 lb 6.4 oz (64.592 kg)     Lab Results  Component Value Date   WBC 6.0 06/08/2013   HGB 13.1 06/08/2013   HCT 39.3 06/08/2013   PLT 335.0 06/08/2013   GLUCOSE 94 06/08/2013   CHOL 230* 02/02/2014   TRIG 111.0 02/02/2014   HDL 65.40 02/02/2014   LDLDIRECT 151.2 06/08/2013   LDLCALC 142* 02/02/2014   ALT 15 02/02/2014   AST 19 02/02/2014   NA 140 06/08/2013   K 4.2 06/08/2013   CL 107  06/08/2013   CREATININE 0.8 06/08/2013   BUN 14 06/08/2013   CO2 27 06/08/2013   TSH 0.22* 08/09/2014    No results found.   Assessment & Plan:  Plan I have discontinued Ms. Jarriel's ibuprofen and pramoxine-hydrocortisone. I am also having her start on meloxicam. Additionally, I am having her maintain her clonazePAM and levothyroxine. We administered cyanocobalamin and ketorolac.  Meds ordered this encounter  Medications  . levothyroxine  (SYNTHROID, LEVOTHROID) 137 MCG tablet    Sig: Take 137 mcg by mouth daily before breakfast.  . meloxicam (MOBIC) 15 MG tablet    Sig: Take 1 tablet (15 mg total) by mouth daily.    Dispense:  30 tablet    Refill:  0  . cyanocobalamin ((VITAMIN B-12)) injection 1,000 mcg    Sig:   . ketorolac (TORADOL) injection 60 mg    Sig:     Problem List Items Addressed This Visit    None    Visit Diagnoses    Chest pain, unspecified chest pain type    -  Primary    Relevant Medications    meloxicam (MOBIC) 15 MG tablet    ketorolac (TORADOL) injection 60 mg (Completed)    Other Relevant Orders    EKG 12-Lead (Completed)    DG Chest 2 View    Other fatigue        Relevant Medications    cyanocobalamin ((VITAMIN B-12)) injection 1,000 mcg (Completed)       Follow-up: Return if symptoms worsen or fail to improve.  Loreen FreudYvonne Lowne, DO

## 2014-10-06 ENCOUNTER — Ambulatory Visit (HOSPITAL_BASED_OUTPATIENT_CLINIC_OR_DEPARTMENT_OTHER)
Admission: RE | Admit: 2014-10-06 | Discharge: 2014-10-06 | Disposition: A | Discharge status: Home / Self Care | Payer: PRIVATE HEALTH INSURANCE | Source: Ambulatory Visit | Attending: Family Medicine | Admitting: Family Medicine

## 2014-10-06 ENCOUNTER — Encounter: Payer: Self-pay | Admitting: Family Medicine

## 2014-10-06 DIAGNOSIS — R079 Chest pain, unspecified: Secondary | ICD-10-CM | POA: Diagnosis present

## 2014-10-06 DIAGNOSIS — E0500 Thyrotoxicosis with diffuse goiter without thyrotoxic crisis or storm: Secondary | ICD-10-CM | POA: Insufficient documentation

## 2014-10-08 ENCOUNTER — Telehealth: Payer: Self-pay | Admitting: Family Medicine

## 2014-10-08 NOTE — Telephone Encounter (Signed)
MSG left to call the office      KP 

## 2014-10-08 NOTE — Telephone Encounter (Signed)
Relation to pt: self  Call back number:[phone]   Reason for call:  Pt inquiring about x ray results taken on 10/05/2014

## 2014-10-08 NOTE — Telephone Encounter (Signed)
Notes Recorded by Lelon PerlaYvonne R Lowne, DO on 10/07/2014 at 9:11 AM NORMAL CXR

## 2014-10-11 NOTE — Telephone Encounter (Signed)
Message left to call the office.    KP 

## 2014-10-27 ENCOUNTER — Telehealth: Payer: Self-pay | Admitting: Family Medicine

## 2014-10-27 NOTE — Telephone Encounter (Signed)
Caller name: Roshaunda Crew Relationship to patient: self Can be reached: [phone]  Reason for call: Pt scheduled for b12 shot 10/28/14 9:15am. Can she come at 9:00am?

## 2014-10-27 NOTE — Telephone Encounter (Signed)
Called patient at [phone] [facility] *Preferred* and notified her that it was OK to come at 9.

## 2014-10-28 ENCOUNTER — Other Ambulatory Visit (INDEPENDENT_AMBULATORY_CARE_PROVIDER_SITE_OTHER): Payer: PRIVATE HEALTH INSURANCE

## 2014-10-28 ENCOUNTER — Other Ambulatory Visit: Payer: PRIVATE HEALTH INSURANCE

## 2014-10-28 ENCOUNTER — Ambulatory Visit (INDEPENDENT_AMBULATORY_CARE_PROVIDER_SITE_OTHER): Payer: PRIVATE HEALTH INSURANCE | Admitting: *Deleted

## 2014-10-28 DIAGNOSIS — E538 Deficiency of other specified B group vitamins: Secondary | ICD-10-CM

## 2014-10-28 DIAGNOSIS — E039 Hypothyroidism, unspecified: Secondary | ICD-10-CM | POA: Diagnosis not present

## 2014-10-28 LAB — VITAMIN B12: Vitamin B-12: 342 pg/mL (ref 211–911)

## 2014-10-28 LAB — TSH: TSH: 0.47 u[IU]/mL (ref 0.35–4.50)

## 2014-10-28 MED ORDER — CYANOCOBALAMIN 1000 MCG/ML IJ SOLN
1000.0000 ug | Freq: Once | INTRAMUSCULAR | Status: AC
  Administered 2014-10-28: 1000 ug via INTRAMUSCULAR

## 2014-10-28 NOTE — Progress Notes (Signed)
Pre visit review using our clinic review tool, if applicable. No additional management support is needed unless otherwise documented below in the visit note.  Patient went to lab to draw B12 level before injection Patient tolerated injection well.

## 2015-06-21 ENCOUNTER — Ambulatory Visit (INDEPENDENT_AMBULATORY_CARE_PROVIDER_SITE_OTHER): Payer: PRIVATE HEALTH INSURANCE | Admitting: Family Medicine

## 2015-06-21 ENCOUNTER — Encounter: Payer: Self-pay | Admitting: Family Medicine

## 2015-06-21 VITALS — BP 120/84 | HR 80 | Temp 98.7°F | Wt 135.6 lb

## 2015-06-21 DIAGNOSIS — M542 Cervicalgia: Secondary | ICD-10-CM

## 2015-06-21 DIAGNOSIS — R102 Pelvic and perineal pain: Secondary | ICD-10-CM

## 2015-06-21 DIAGNOSIS — R300 Dysuria: Secondary | ICD-10-CM | POA: Diagnosis not present

## 2015-06-21 DIAGNOSIS — N390 Urinary tract infection, site not specified: Secondary | ICD-10-CM | POA: Diagnosis not present

## 2015-06-21 DIAGNOSIS — Z1159 Encounter for screening for other viral diseases: Secondary | ICD-10-CM

## 2015-06-21 DIAGNOSIS — E785 Hyperlipidemia, unspecified: Secondary | ICD-10-CM

## 2015-06-21 DIAGNOSIS — Z114 Encounter for screening for human immunodeficiency virus [HIV]: Secondary | ICD-10-CM

## 2015-06-21 DIAGNOSIS — R82998 Other abnormal findings in urine: Secondary | ICD-10-CM

## 2015-06-21 LAB — POCT URINALYSIS DIPSTICK
Bilirubin, UA: NEGATIVE
Glucose, UA: NEGATIVE
Ketones, UA: NEGATIVE
NITRITE UA: NEGATIVE
SPEC GRAV UA: 1.015
UROBILINOGEN UA: 0.2
pH, UA: 6

## 2015-06-21 MED ORDER — METHOCARBAMOL 500 MG PO TABS: 500.0000 mg | ORAL_TABLET | Freq: Four times a day (QID) | ORAL | Status: DC

## 2015-06-21 MED ORDER — PHENAZOPYRIDINE HCL 200 MG PO TABS: 200.0000 mg | ORAL_TABLET | Freq: Three times a day (TID) | ORAL | Status: DC | PRN

## 2015-06-21 MED ORDER — CIPROFLOXACIN HCL 500 MG PO TABS: 500.0000 mg | ORAL_TABLET | Freq: Two times a day (BID) | ORAL | Status: DC

## 2015-06-21 NOTE — Progress Notes (Signed)
Pre visit review using our clinic review tool, if applicable. No additional management support is needed unless otherwise documented below in the visit note. 

## 2015-06-21 NOTE — Progress Notes (Signed)
  WUJ:WJXBJY Lowne, DO Chief Complaint  Patient presents with  . Pelvic Pain    Dysuria and Frequency x's 1 day  . Hypertension    c/o BP slightly higher than normal    Current Issues:  Presents with 7 days of presure and burning and frequency x 1 day Associated symptoms include:  dysuria and urinary frequency She is also c/o frequent headaches and occassionall numbness and tingling in arms (which has resolved)--- pain in neck an shoulders con't There is no history of of similar symptoms. Sexually active:  Yes with female.   No concern for STI.  Prior to Admission medications   Medication Sig Start Date End Date Taking? Authorizing Provider  clonazePAM (KLONOPIN) 1 MG tablet TAKE 1 TABLET BY MOUTH 2 TIMES A DAY AS NEEDED FOR ANXIETY 07/26/14  Yes Lelon Perla, DO  levothyroxine (SYNTHROID, LEVOTHROID) 100 MCG tablet Take 1 tablet by mouth daily. 04/12/15  Yes Historical Provider, MD    Review of Systems:see above  PE:  BP 120/84 mmHg  Pulse 80  Temp(Src) 98.7 F (37.1 C) (Oral)  Wt 135 lb 9.6 oz (61.508 kg)  SpO2 98% Constitutional-- no fever , chills Heart  +s1s2 Lungs--ctabl/l MS-- + muscle spasm traps b/l with no numbness / tingling Back-- no pain with palp Abdomen-- soft nt   Results for orders placed or performed in visit on 06/21/15  POCT Urinalysis Dipstick  Result Value Ref Range   Color, UA Yellow    Clarity, UA Cloudy    Glucose, UA Neg    Bilirubin, UA Neg    Ketones, UA Neg    Spec Grav, UA 1.015    Blood, UA Large    pH, UA 6.0    Protein, UA Small    Urobilinogen, UA 0.2    Nitrite, UA Neg    Leukocytes, UA large (3+) (A) Negative    Assessment and Plan:  1. Pelvic pain in female   - POCT Urinalysis Dipstick - Urine Culture  2. Dysuria  cipro x 3 days  - POCT Urinalysis Dipstick - Urine Culture2  3. Leukocytes in urine   - Urine Culture

## 2015-06-21 NOTE — Patient Instructions (Signed)

## 2015-07-06 ENCOUNTER — Encounter: Payer: Self-pay | Admitting: Family Medicine

## 2015-07-07 ENCOUNTER — Other Ambulatory Visit: Payer: Self-pay | Admitting: Family Medicine

## 2015-07-08 ENCOUNTER — Encounter: Payer: Self-pay | Admitting: Family Medicine

## 2015-07-08 ENCOUNTER — Telehealth: Payer: Self-pay | Admitting: *Deleted

## 2015-07-08 ENCOUNTER — Other Ambulatory Visit (INDEPENDENT_AMBULATORY_CARE_PROVIDER_SITE_OTHER): Payer: PRIVATE HEALTH INSURANCE

## 2015-07-08 DIAGNOSIS — R8290 Unspecified abnormal findings in urine: Secondary | ICD-10-CM

## 2015-07-08 LAB — URINALYSIS, ROUTINE W REFLEX MICROSCOPIC
BILIRUBIN URINE: NEGATIVE
KETONES UR: NEGATIVE
LEUKOCYTES UA: NEGATIVE
NITRITE: NEGATIVE
PH: 7 (ref 5.0–8.0)
Specific Gravity, Urine: <=1.005 — AB (ref 1.000–1.030)
TOTAL PROTEIN, URINE-UPE24: NEGATIVE
URINE GLUCOSE: NEGATIVE
UROBILINOGEN UA: 0.2 (ref 0.0–1.0)

## 2015-07-08 MED ORDER — CIPROFLOXACIN HCL 250 MG PO TABS: 250.0000 mg | ORAL_TABLET | Freq: Two times a day (BID) | ORAL | Status: DC

## 2015-07-08 NOTE — Telephone Encounter (Signed)
ua dip-- did not look like much  Culture pending If symptomatic , cipro 250 mg bid x 3 days

## 2015-07-09 LAB — URINE CULTURE
COLONY COUNT: NO GROWTH
ORGANISM ID, BACTERIA: NO GROWTH

## 2015-08-25 ENCOUNTER — Encounter: Payer: Self-pay | Admitting: Family Medicine

## 2015-08-25 ENCOUNTER — Ambulatory Visit (INDEPENDENT_AMBULATORY_CARE_PROVIDER_SITE_OTHER): Payer: PRIVATE HEALTH INSURANCE | Admitting: Family Medicine

## 2015-08-25 VITALS — BP 120/70 | HR 68 | Temp 97.9°F | Ht 66.75 in | Wt 130.2 lb

## 2015-08-25 DIAGNOSIS — Z114 Encounter for screening for human immunodeficiency virus [HIV]: Secondary | ICD-10-CM | POA: Diagnosis not present

## 2015-08-25 DIAGNOSIS — N390 Urinary tract infection, site not specified: Secondary | ICD-10-CM

## 2015-08-25 DIAGNOSIS — F329 Major depressive disorder, single episode, unspecified: Secondary | ICD-10-CM | POA: Diagnosis not present

## 2015-08-25 DIAGNOSIS — R300 Dysuria: Secondary | ICD-10-CM | POA: Diagnosis not present

## 2015-08-25 DIAGNOSIS — F32A Depression, unspecified: Secondary | ICD-10-CM

## 2015-08-25 DIAGNOSIS — R82998 Other abnormal findings in urine: Secondary | ICD-10-CM

## 2015-08-25 DIAGNOSIS — E785 Hyperlipidemia, unspecified: Secondary | ICD-10-CM | POA: Diagnosis not present

## 2015-08-25 DIAGNOSIS — Z1159 Encounter for screening for other viral diseases: Secondary | ICD-10-CM

## 2015-08-25 LAB — LIPID PANEL
Cholesterol: 268 mg/dL — ABNORMAL HIGH (ref 0–200)
HDL: 71.4 mg/dL (ref >39.00)
LDL Cholesterol: 184 mg/dL — ABNORMAL HIGH (ref 0–99)
NONHDL: 196.17
TRIGLYCERIDES: 63 mg/dL (ref 0.0–149.0)
Total CHOL/HDL Ratio: 4
VLDL: 12.6 mg/dL (ref 0.0–40.0)

## 2015-08-25 LAB — COMPREHENSIVE METABOLIC PANEL
ALK PHOS: 47 U/L (ref 39–117)
ALT: 13 U/L (ref 0–35)
AST: 19 U/L (ref 0–37)
Albumin: 4.3 g/dL (ref 3.5–5.2)
BILIRUBIN TOTAL: 0.7 mg/dL (ref 0.2–1.2)
BUN: 13 mg/dL (ref 6–23)
CALCIUM: 9.4 mg/dL (ref 8.4–10.5)
CO2: 30 mEq/L (ref 19–32)
CREATININE: 0.92 mg/dL (ref 0.40–1.20)
Chloride: 104 mEq/L (ref 96–112)
GFR: 68.13 mL/min (ref >60.00)
Glucose, Bld: 92 mg/dL (ref 70–99)
Potassium: 4.1 mEq/L (ref 3.5–5.1)
Sodium: 138 mEq/L (ref 135–145)
TOTAL PROTEIN: 7.4 g/dL (ref 6.0–8.3)

## 2015-08-25 LAB — POCT URINALYSIS DIPSTICK
BILIRUBIN UA: NEGATIVE
GLUCOSE UA: NEGATIVE
Ketones, UA: NEGATIVE
Leukocytes, UA: NEGATIVE
NITRITE UA: NEGATIVE
Protein, UA: NEGATIVE
RBC UA: NEGATIVE
Spec Grav, UA: 1.02
Urobilinogen, UA: 0.2
pH, UA: 6.5

## 2015-08-25 LAB — HEPATITIS C ANTIBODY: HCV AB: NEGATIVE

## 2015-08-25 LAB — HIV ANTIBODY (ROUTINE TESTING W REFLEX): HIV 1&2 Ab, 4th Generation: NONREACTIVE

## 2015-08-25 MED ORDER — FLUOXETINE HCL 10 MG PO CAPS: 10.0000 mg | ORAL_CAPSULE | Freq: Every day | ORAL | Status: DC

## 2015-08-25 NOTE — Patient Instructions (Signed)
Fatigue  Fatigue is feeling tired all of the time, a lack of energy, or a lack of motivation. Occasional or mild fatigue is often a normal response to activity or life in general. However, long-lasting (chronic) or extreme fatigue may indicate an underlying medical condition.  HOME CARE INSTRUCTIONS   Watch your fatigue for any changes. The following actions may help to lessen any discomfort you are feeling:  · Talk to your health care provider about how much sleep you need each night. Try to get the required amount every night.  · Take medicines only as directed by your health care provider.  · Eat a healthy and nutritious diet. Ask your health care provider if you need help changing your diet.  · Drink enough fluid to keep your urine clear or pale yellow.  · Practice ways of relaxing, such as yoga, meditation, massage therapy, or acupuncture.  · Exercise regularly.    · Change situations that cause you stress. Try to keep your work and personal routine reasonable.  · Do not abuse illegal drugs.  · Limit alcohol intake to no more than 1 drink per day for nonpregnant women and 2 drinks per day for men. One drink equals 12 ounces of beer, 5 ounces of wine, or 1½ ounces of hard liquor.  · Take a multivitamin, if directed by your health care provider.  SEEK MEDICAL CARE IF:   · Your fatigue does not get better.  · You have a fever.    · You have unintentional weight loss or gain.  · You have headaches.    · You have difficulty:      Falling asleep.    Sleeping throughout the night.  · You feel angry, guilty, anxious, or sad.     · You are unable to have a bowel movement (constipation).    · You skin is dry.     · Your legs or another part of your body is swollen.    SEEK IMMEDIATE MEDICAL CARE IF:   · You feel confused.    · Your vision is blurry.  · You feel faint or pass out.    · You have a severe headache.    · You have severe abdominal, pelvic, or back pain.    · You have chest pain, shortness of breath, or an  irregular or fast heartbeat.    · You are unable to urinate or you urinate less than normal.    · You develop abnormal bleeding, such as bleeding from the rectum, vagina, nose, lungs, or nipples.  · You vomit blood.     · You have thoughts about harming yourself or committing suicide.    · You are worried that you might harm someone else.       This information is not intended to replace advice given to you by your health care provider. Make sure you discuss any questions you have with your health care provider.     Document Released: 03/11/2007 Document Revised: 06/04/2014 Document Reviewed: 09/15/2013  Elsevier Interactive Patient Education ©2016 [employer]

## 2015-08-25 NOTE — Progress Notes (Signed)
Patient ID: Diamond Watts, female    DOB: 11/04/1965  Age: 50 y.o. MRN: [id number]    Subjective:  Subjective HPI Diamond Watts presents for labs and c/o fatigue that has gotten worse.  She admits to being depressed and wants to try something different for depression;;;  Review of Systems  Constitutional: Positive for fatigue. Negative for diaphoresis, appetite change and unexpected weight change.  Eyes: Negative for pain, redness and visual disturbance.  Respiratory: Negative for cough, chest tightness, shortness of breath and wheezing.   Cardiovascular: Negative for chest pain, palpitations and leg swelling.  Endocrine: Negative for cold intolerance, heat intolerance, polydipsia, polyphagia and polyuria.  Genitourinary: Negative for dysuria, frequency and difficulty urinating.  Neurological: Negative for dizziness, light-headedness, numbness and headaches.    History Past Medical History  Diagnosis Date  . Chest pain     onset May 2010; neg gxt 02/14/09  . Hypothyroidism   . B12 deficiency   . Asymptomatic postmenopausal status (age-related) (natural)   . External hemorrhoids without mention of complication   . Vulvitis   . Anxiety   . Tremor     nec  . Depression   . Grave's disease     hx     She has past surgical history that includes urethal tube stretched; Foot surgery; Refractive surgery; abdominoplasty (09/06/2014); and mastoplexy (Bilateral, 09/06/2014).   Her family history includes Depression in her father; Heart disease in her mother; Hyperlipidemia in her mother; Hypertension in her mother; Liver cancer in her father. There is no history of Colon cancer.She reports that she has never smoked. She has never used smokeless tobacco. She reports that she drinks alcohol. She reports that she does not use illicit drugs.  Current Outpatient Prescriptions on File Prior to Visit  Medication Sig Dispense Refill  . clonazePAM (KLONOPIN) 1 MG tablet TAKE 1 TABLET BY MOUTH 2  TIMES A DAY AS NEEDED FOR ANXIETY 60 tablet 1  . levothyroxine (SYNTHROID, LEVOTHROID) 100 MCG tablet Take 1 tablet by mouth daily.  11   No current facility-administered medications on file prior to visit.     Objective:  Objective Physical Exam  Constitutional: She is oriented to person, place, and time. She appears well-developed and well-nourished.  HENT:  Head: Normocephalic and atraumatic.  Eyes: Conjunctivae and EOM are normal.  Neck: Normal range of motion. Neck supple. No JVD present. Carotid bruit is not present. No thyromegaly present.  Cardiovascular: Normal rate, regular rhythm and normal heart sounds.   No murmur heard. Pulmonary/Chest: Effort normal and breath sounds normal. No respiratory distress. She has no wheezes. She has no rales. She exhibits no tenderness.  Musculoskeletal: She exhibits no edema.  Neurological: She is alert and oriented to person, place, and time.  Psychiatric: She has a normal mood and affect. Her behavior is normal. Judgment and thought content normal.  Nursing note and vitals reviewed.  BP 120/70 mmHg  Pulse 68  Temp(Src) 97.9 F (36.6 C) (Oral)  Ht 5' 6.75" (1.695 m)  Wt 130 lb 3.2 oz (59.058 kg)  BMI 20.56 kg/m2  SpO2 99% Wt Readings from Last 3 Encounters:  08/25/15 130 lb 3.2 oz (59.058 kg)  06/21/15 135 lb 9.6 oz (61.508 kg)  10/05/14 136 lb 12.8 oz (62.052 kg)     Lab Results  Component Value Date   WBC 6.0 06/08/2013   HGB 13.1 06/08/2013   HCT 39.3 06/08/2013   PLT 335.0 06/08/2013   GLUCOSE 92 08/25/2015   CHOL  268* 08/25/2015   TRIG 63.0 08/25/2015   HDL 71.40 08/25/2015   LDLDIRECT 151.2 06/08/2013   LDLCALC 184* 08/25/2015   ALT 13 08/25/2015   AST 19 08/25/2015   NA 138 08/25/2015   K 4.1 08/25/2015   CL 104 08/25/2015   CREATININE 0.92 08/25/2015   BUN 13 08/25/2015   CO2 30 08/25/2015   TSH 0.47 10/28/2014    Dg Chest 2 View  10/07/2014  CLINICAL DATA:  Chest pain for 4 days.  History of Graves  disease EXAM: CHEST  2 VIEW COMPARISON:  April 25, 2009 FINDINGS: Lungs are clear. Heart size and pulmonary vascularity are normal. No adenopathy. No pneumothorax. No bone lesions. Trachea midline. IMPRESSION: No abnormality noted. Electronically Signed   By: Bretta Bang III M.D.   On: 10/07/2014 08:51     Assessment & Plan:  Plan I have discontinued Ms. Kraynak's methocarbamol, ciprofloxacin, phenazopyridine, and ciprofloxacin. I am also having her start on FLUoxetine. Additionally, I am having her maintain her clonazePAM and levothyroxine.  Meds ordered this encounter  Medications  . FLUoxetine (PROZAC) 10 MG capsule    Sig: Take 1 capsule (10 mg total) by mouth daily.    Dispense:  30 capsule    Refill:  3    Problem List Items Addressed This Visit    None    Visit Diagnoses    Depression    -  Primary    Relevant Medications    FLUoxetine (PROZAC) 10 MG capsule    Hyperlipidemia        Encounter for screening for HIV        Dysuria        Leukocytes in urine        Need for hepatitis C screening test           Follow-up: Return in about 4 weeks (around 09/22/2015), or if symptoms worsen or fail to improve, for depression.  Donato Schultz, DO

## 2015-08-25 NOTE — Progress Notes (Signed)
Pre visit review using our clinic review tool, if applicable. No additional management support is needed unless otherwise documented below in the visit note. 

## 2015-08-31 ENCOUNTER — Encounter: Payer: Self-pay | Admitting: Family Medicine

## 2015-09-05 ENCOUNTER — Other Ambulatory Visit: Payer: Self-pay | Admitting: Family Medicine

## 2015-09-05 DIAGNOSIS — E039 Hypothyroidism, unspecified: Secondary | ICD-10-CM

## 2015-09-05 DIAGNOSIS — E538 Deficiency of other specified B group vitamins: Secondary | ICD-10-CM

## 2015-09-21 ENCOUNTER — Encounter: Payer: Self-pay | Admitting: Family Medicine

## 2015-09-21 NOTE — Telephone Encounter (Signed)
Please advise in Dr.Lowne's absence.   KP 

## 2015-09-21 NOTE — Telephone Encounter (Signed)
Pt is due for follow up with Dr. Laury AxonLowne. I would advise her to arrange follow up appointment and discuss med changes at that visit.

## 2015-09-23 ENCOUNTER — Other Ambulatory Visit (INDEPENDENT_AMBULATORY_CARE_PROVIDER_SITE_OTHER): Payer: PRIVATE HEALTH INSURANCE

## 2015-09-23 DIAGNOSIS — E039 Hypothyroidism, unspecified: Secondary | ICD-10-CM | POA: Diagnosis not present

## 2015-09-23 DIAGNOSIS — E538 Deficiency of other specified B group vitamins: Secondary | ICD-10-CM

## 2015-09-23 LAB — TSH: TSH: 5.81 u[IU]/mL — AB (ref 0.35–4.50)

## 2015-09-23 LAB — VITAMIN B12: Vitamin B-12: 344 pg/mL (ref 211–911)

## 2015-09-27 ENCOUNTER — Encounter: Payer: Self-pay | Admitting: Family Medicine

## 2015-09-27 MED ORDER — LEVOTHYROXINE SODIUM 112 MCG PO TABS: 112.0000 ug | ORAL_TABLET | Freq: Every day | ORAL | Status: DC

## 2015-10-18 ENCOUNTER — Encounter: Payer: Self-pay | Admitting: Family Medicine

## 2015-10-18 ENCOUNTER — Ambulatory Visit (INDEPENDENT_AMBULATORY_CARE_PROVIDER_SITE_OTHER): Payer: PRIVATE HEALTH INSURANCE | Admitting: Family Medicine

## 2015-10-18 VITALS — BP 116/88 | HR 81 | Temp 98.3°F | Ht 67.0 in | Wt 126.4 lb

## 2015-10-18 DIAGNOSIS — E538 Deficiency of other specified B group vitamins: Secondary | ICD-10-CM | POA: Diagnosis not present

## 2015-10-18 DIAGNOSIS — R5383 Other fatigue: Secondary | ICD-10-CM

## 2015-10-18 DIAGNOSIS — F411 Generalized anxiety disorder: Secondary | ICD-10-CM | POA: Diagnosis not present

## 2015-10-18 LAB — CBC WITH DIFFERENTIAL/PLATELET
BASOS ABS: 0 10*3/uL (ref 0.0–0.1)
BASOS PCT: 0.6 % (ref 0.0–3.0)
EOS ABS: 0 10*3/uL (ref 0.0–0.7)
Eosinophils Relative: 0.9 % (ref 0.0–5.0)
HEMATOCRIT: 39.3 % (ref 36.0–46.0)
Hemoglobin: 13.1 g/dL (ref 12.0–15.0)
LYMPHS ABS: 1.3 10*3/uL (ref 0.7–4.0)
LYMPHS PCT: 31.4 % (ref 12.0–46.0)
MCHC: 33.3 g/dL (ref 30.0–36.0)
MCV: 95 fl (ref 78.0–100.0)
Monocytes Absolute: 0.2 10*3/uL (ref 0.1–1.0)
Monocytes Relative: 5.9 % (ref 3.0–12.0)
NEUTROS ABS: 2.6 10*3/uL (ref 1.4–7.7)
NEUTROS PCT: 61.2 % (ref 43.0–77.0)
PLATELETS: 378 10*3/uL (ref 150.0–400.0)
RBC: 4.13 Mil/uL (ref 3.87–5.11)
RDW: 14.5 % (ref 11.5–15.5)
WBC: 4.2 10*3/uL (ref 4.0–10.5)

## 2015-10-18 LAB — COMPREHENSIVE METABOLIC PANEL
ALT: 12 U/L (ref 0–35)
AST: 16 U/L (ref 0–37)
Albumin: 4.4 g/dL (ref 3.5–5.2)
Alkaline Phosphatase: 44 U/L (ref 39–117)
BILIRUBIN TOTAL: 0.6 mg/dL (ref 0.2–1.2)
BUN: 8 mg/dL (ref 6–23)
CALCIUM: 9.4 mg/dL (ref 8.4–10.5)
CHLORIDE: 104 meq/L (ref 96–112)
CO2: 28 meq/L (ref 19–32)
CREATININE: 0.8 mg/dL (ref 0.40–1.20)
GFR: 80.01 mL/min (ref >60.00)
GLUCOSE: 85 mg/dL (ref 70–99)
Potassium: 4 mEq/L (ref 3.5–5.1)
Sodium: 137 mEq/L (ref 135–145)
Total Protein: 7.3 g/dL (ref 6.0–8.3)

## 2015-10-18 LAB — MONONUCLEOSIS SCREEN: Mono Screen: NEGATIVE

## 2015-10-18 MED ORDER — CYANOCOBALAMIN 1000 MCG/ML IJ SOLN: 1000.0000 ug | Freq: Once | INTRAMUSCULAR | Status: DC

## 2015-10-18 MED ORDER — CLONAZEPAM 1 MG PO TABS: ORAL_TABLET | ORAL | Status: DC

## 2015-10-18 MED ORDER — CYANOCOBALAMIN 1000 MCG/ML IJ SOLN
1000.0000 ug | Freq: Once | INTRAMUSCULAR | Status: AC
  Administered 2015-10-18: 1000 ug via INTRAMUSCULAR

## 2015-10-18 NOTE — Patient Instructions (Signed)
Chronic Fatigue Syndrome Chronic fatigue syndrome (CFS) is a condition in which there is lasting, extreme tiredness (fatigue) that does not improve with rest. CFS affects women up to four times more often than men. If you have CFS, fatigue and other symptoms can make it hard for you to get through your day. There is no treatment or cure. You will need to work closely with your health care provider to come up with a treatment plan that works for you. CAUSES  No one knows what causes CFS. It may be triggered by a flu-like illness or by mono. Other triggers may include:  An abnormal immune system.  Low blood pressure.  Poor diet.  Physical or emotional stress. SIGNS AND SYMPTOMS The main symptom is fatigue that lasts all day, especially after physical or mental stress. Other common symptoms include:  An extreme loss of energy with no obvious cause.  Muscle or joint soreness.  Severe weakness.  Frequent headaches.  Fever.  Sore throat.  Swollen lymph glands.  Sleep is not refreshing.  Loss of concentration or memory. Less common symptoms may include:  Chills.  Night sweats.  Tingling or numbness.  Blurred vision.  Dizziness.  Sensitivity to noise or odors.  Mood swings.  Anxiety, panic attacks, and depression. Your symptoms may come and go, or you may have them all the time. DIAGNOSIS  There are no tests that can help health care providers diagnose CFS. It may take a long time for you to get a correct diagnosis. Your health care provider may need to do a number of tests to rule out other conditions that could be causing your symptoms. You may be diagnosed with CFS if:  You have fatigue that has lasted for at least six months.  Your fatigue is not relieved by rest.  Your fatigue is not caused by another condition.  Your fatigue is severe enough to interfere with work and daily activities.  You have at least four common symptoms of CFS. TREATMENT  There is no  cure for CFS at this time. The condition affects everyone differently. You will need to work with your health care provider to find the best treatment for your symptoms. Treatment may include:  Improving sleep with a regular bedtime routine.  Avoiding caffeine, alcohol, and tobacco.  Doing light exercise and stretching during the day.  Taking medicine to help you sleep.  Taking over-the-counter medicines to relieve joint or muscle pain.  Learning and practicing relaxation techniques.  Using memory aids or doing brain teasers to improve memory and concentration.  Seeing a mental health professional to evaluate and treat depression, if necessary.  Trying massage therapy, acupuncture, and movement exercises, like yoga or tai chi. HOME CARE INSTRUCTIONS Work closely with your health care provider to follow your treatment plan at home. You may need to make major lifestyle changes. If treatment does not seem to help, get a second opinion. You may get help from many health care providers, including doctors, mental health specialists, physical therapists, and rehabilitation therapists. Having the support of friends and loved ones is also important. SEEK MEDICAL CARE IF:  Your symptoms are not responding to treatment.  You are having strong feelings of anger, guilt, anxiety, or depression.   This information is not intended to replace advice given to you by your health care provider. Make sure you discuss any questions you have with your health care provider.   Document Released: 06/21/2004 Document Revised: 06/04/2014 Document Reviewed: 04/03/2013 Elsevier Interactive Patient   Education 2016 [employer].

## 2015-10-18 NOTE — Progress Notes (Signed)
Patient ID: Diamond Watts, female    DOB: 1965/09/17  Age: 50 y.o. MRN: [id number]    Subjective:  Subjective HPI Diamond Watts presents for F/U fatigue.  She stopped the prozac because it caused headaches.    Review of Systems  Constitutional: Negative for diaphoresis, appetite change, fatigue and unexpected weight change.  Eyes: Negative for pain, redness and visual disturbance.  Respiratory: Negative for cough, chest tightness, shortness of breath and wheezing.   Cardiovascular: Negative for chest pain, palpitations and leg swelling.  Endocrine: Negative for cold intolerance, heat intolerance, polydipsia, polyphagia and polyuria.  Genitourinary: Negative for dysuria, frequency and difficulty urinating.  Neurological: Negative for dizziness, light-headedness, numbness and headaches.    History Past Medical History  Diagnosis Date  . Chest pain     onset May 2010; neg gxt 02/14/09  . Hypothyroidism   . B12 deficiency   . Asymptomatic postmenopausal status (age-related) (natural)   . External hemorrhoids without mention of complication   . Vulvitis   . Anxiety   . Tremor     nec  . Depression   . Grave's disease     hx     She has past surgical history that includes urethal tube stretched; Foot surgery; Refractive surgery; abdominoplasty (09/06/2014); and mastoplexy (Bilateral, 09/06/2014).   Her family history includes Depression in her father; Heart disease in her mother; Hyperlipidemia in her mother; Hypertension in her mother; Liver cancer in her father. There is no history of Colon cancer.She reports that she has never smoked. She has never used smokeless tobacco. She reports that she drinks alcohol. She reports that she does not use illicit drugs.  Current Outpatient Prescriptions on File Prior to Visit  Medication Sig Dispense Refill  . clonazePAM (KLONOPIN) 1 MG tablet TAKE 1 TABLET BY MOUTH 2 TIMES A DAY AS NEEDED FOR ANXIETY 60 tablet 1  . levothyroxine (SYNTHROID,  LEVOTHROID) 112 MCG tablet Take 1 tablet (112 mcg total) by mouth daily. 30 tablet 2   No current facility-administered medications on file prior to visit.     Objective:  Objective Physical Exam  Constitutional: She is oriented to person, place, and time. She appears well-developed and well-nourished.  HENT:  Head: Normocephalic and atraumatic.  Eyes: Conjunctivae and EOM are normal.  Neck: Normal range of motion. Neck supple. No JVD present. Carotid bruit is not present. No thyromegaly present.  Cardiovascular: Normal rate, regular rhythm and normal heart sounds.   No murmur heard. Pulmonary/Chest: Effort normal and breath sounds normal. No respiratory distress. She has no wheezes. She has no rales. She exhibits no tenderness.  Musculoskeletal: She exhibits no edema.  Neurological: She is alert and oriented to person, place, and time.  Psychiatric: She has a normal mood and affect. Her behavior is normal.  Nursing note and vitals reviewed.  BP 116/88 mmHg  Pulse 81  Temp(Src) 98.3 F (36.8 C) (Oral)  Ht  (1.702 m)  Wt 126 lb 6.4 oz (57.335 kg)  BMI 19.79 kg/m2  SpO2 98% Wt Readings from Last 3 Encounters:  10/18/15 126 lb 6.4 oz (57.335 kg)  08/25/15 130 lb 3.2 oz (59.058 kg)  06/21/15 135 lb 9.6 oz (61.508 kg)     Lab Results  Component Value Date   WBC 6.0 06/08/2013   HGB 13.1 06/08/2013   HCT 39.3 06/08/2013   PLT 335.0 06/08/2013   GLUCOSE 92 08/25/2015   CHOL 268* 08/25/2015   TRIG 63.0 08/25/2015   HDL 71.40 08/25/2015  LDLDIRECT 151.2 06/08/2013   LDLCALC 184* 08/25/2015   ALT 13 08/25/2015   AST 19 08/25/2015   NA 138 08/25/2015   K 4.1 08/25/2015   CL 104 08/25/2015   CREATININE 0.92 08/25/2015   BUN 13 08/25/2015   CO2 30 08/25/2015   TSH 5.81* 09/23/2015    Dg Chest 2 View  10/07/2014  CLINICAL DATA:  Chest pain for 4 days.  History of Graves disease EXAM: CHEST  2 VIEW COMPARISON:  April 25, 2009 FINDINGS: Lungs are clear. Heart size  and pulmonary vascularity are normal. No adenopathy. No pneumothorax. No bone lesions. Trachea midline. IMPRESSION: No abnormality noted. Electronically Signed   By: Bretta BangWilliam  Woodruff III M.D.   On: 10/07/2014 08:51     Assessment & Plan:  Plan I have discontinued Diamond Watts's FLUoxetine. I am also having her maintain her clonazePAM and levothyroxine.  No orders of the defined types were placed in this encounter.    Problem List Items Addressed This Visit      Unprioritized   B12 deficiency    Other Visit Diagnoses    Other fatigue    -  Primary    Relevant Orders    B. burgdorfi antibodies    Rocky mtn spotted fvr ab, IgM-blood    CBC with Differential/Platelet    Comprehensive metabolic panel    Thyroid Panel With TSH    Monospot    Epstein-Barr virus VCA antibody panel       Follow-up: Return if symptoms worsen or fail to improve.  Donato SchultzYvonne R Lowne Chase, DO

## 2015-10-19 LAB — THYROID PANEL WITH TSH
Free Thyroxine Index: 3.7 (ref 1.4–3.8)
T3 Uptake: 35 % (ref 22–35)
T4 TOTAL: 10.6 ug/dL (ref 4.5–12.0)
TSH: 2.67 mIU/L

## 2015-10-19 LAB — LYME AB/WESTERN BLOT REFLEX

## 2015-10-19 LAB — EPSTEIN-BARR VIRUS VCA ANTIBODY PANEL
EBV VCA IGG: 386 U/mL — AB (ref <18.0)
EBV VCA IgM: <10 U/mL (ref <36.0)

## 2015-10-20 ENCOUNTER — Telehealth: Payer: Self-pay | Admitting: Family Medicine

## 2015-10-20 DIAGNOSIS — R300 Dysuria: Secondary | ICD-10-CM

## 2015-10-20 NOTE — Telephone Encounter (Signed)
Please advise      KP 

## 2015-10-20 NOTE — Telephone Encounter (Signed)
Caller name: Self  Can be reached: [phone]   Reason for call: Patient thinks she has a UTI and wants order entered so that she can come to the lab tomorrow morning for a UA. States it is okay to leave a message on her VM if she does not answer.

## 2015-10-21 ENCOUNTER — Other Ambulatory Visit (INDEPENDENT_AMBULATORY_CARE_PROVIDER_SITE_OTHER): Payer: PRIVATE HEALTH INSURANCE

## 2015-10-21 DIAGNOSIS — R300 Dysuria: Secondary | ICD-10-CM

## 2015-10-21 LAB — ROCKY MTN SPOTTED FVR ABS PNL(IGG+IGM)
RMSF IGG: NOT DETECTED
RMSF IgM: NOT DETECTED

## 2015-10-21 NOTE — Telephone Encounter (Signed)
Message left to call the office.    KP 

## 2015-10-21 NOTE — Telephone Encounter (Signed)
Ok to go to lab to leave ua

## 2015-10-21 NOTE — Telephone Encounter (Signed)
Called patient. She states that she was in a lot of discomfort last night so she went and got an OTC medication that turns the color of the urine. States she does not think a UA would be accurate if she did it today but will call back if she continues to have the symptoms.

## 2015-10-21 NOTE — Telephone Encounter (Signed)
Spoke with patient and made her aware she would still need to have a Urine culture done if she thinks she has a UTI, I made her aware the culture it what tells us what treatment would be sufficient for her symptoms. She agreed. She said she would come in today to leave a UA and Culture,    KP

## 2015-10-21 NOTE — Addendum Note (Signed)
Addended by: Arnette NorrisPAYNE, Raju Coppolino P on: 10/21/2015 01:58 PM   Modules accepted: Orders

## 2015-10-23 LAB — URINE CULTURE
COLONY COUNT: NO GROWTH
Organism ID, Bacteria: NO GROWTH

## 2015-10-24 ENCOUNTER — Encounter: Payer: Self-pay | Admitting: Family Medicine

## 2015-10-25 ENCOUNTER — Other Ambulatory Visit: Payer: Self-pay | Admitting: Family Medicine

## 2015-10-25 DIAGNOSIS — R110 Nausea: Secondary | ICD-10-CM

## 2015-10-25 DIAGNOSIS — R5382 Chronic fatigue, unspecified: Secondary | ICD-10-CM

## 2015-10-25 MED ORDER — OMEPRAZOLE 40 MG PO CPDR: 40.0000 mg | DELAYED_RELEASE_CAPSULE | Freq: Every day | ORAL | Status: DC

## 2015-10-25 NOTE — Telephone Encounter (Signed)
Spoke with pt.  Pt is feeling nauseous and fatigued she is requesting to go to someone for chronic fatigue.   Will refer to rheum Call in omeprazole for nausea Call or rto prn

## 2015-10-26 ENCOUNTER — Encounter: Payer: Self-pay | Admitting: Family Medicine

## 2015-10-26 ENCOUNTER — Other Ambulatory Visit: Payer: Self-pay | Admitting: Family Medicine

## 2015-10-26 DIAGNOSIS — R110 Nausea: Secondary | ICD-10-CM

## 2015-10-27 ENCOUNTER — Telehealth: Payer: Self-pay | Admitting: Family Medicine

## 2015-10-27 MED ORDER — ONDANSETRON HCL 4 MG PO TABS: 4.0000 mg | ORAL_TABLET | Freq: Four times a day (QID) | ORAL | Status: DC | PRN

## 2015-10-27 NOTE — Telephone Encounter (Signed)
Medication filled to pharmacy as requested.   

## 2015-10-27 NOTE — Telephone Encounter (Signed)
Can be reached: [phone] Pharmacy: [facility] [place] [address]  Reason for call: Pt would like RX sent today for the zofran that was offered until she is able to see GI because stomach is really bothering her.

## 2015-10-27 NOTE — Telephone Encounter (Signed)
zofran 4 mg qid prn  #30 I actually spoke to her about omeprazole and said I did not want to do zofran unless necessary because of possible fatigue

## 2015-10-27 NOTE — Telephone Encounter (Signed)
Please advise. I don't see any documentation about zofran, just the omeprazole which has already been sent.

## 2015-10-31 ENCOUNTER — Encounter: Payer: Self-pay | Admitting: Family Medicine

## 2015-10-31 ENCOUNTER — Other Ambulatory Visit: Payer: Self-pay | Admitting: Family Medicine

## 2015-10-31 DIAGNOSIS — R634 Abnormal weight loss: Secondary | ICD-10-CM

## 2015-11-01 NOTE — Telephone Encounter (Signed)
Diamond Watts would you refer the patient to [facility].     KP

## 2015-11-02 NOTE — Telephone Encounter (Signed)
Due to new c/o dark blood in stool, pt needs OV to be seen today in office.

## 2015-11-03 ENCOUNTER — Encounter: Payer: Self-pay | Admitting: Family Medicine

## 2015-11-03 NOTE — Telephone Encounter (Signed)
D/w pt We are working on the referrals---  Records have been sent to [facility]-- waiting for phone call back Will try to get CTs scheduled for tomorrow

## 2015-11-04 ENCOUNTER — Other Ambulatory Visit: Payer: Self-pay | Admitting: Family Medicine

## 2015-11-04 ENCOUNTER — Ambulatory Visit (HOSPITAL_BASED_OUTPATIENT_CLINIC_OR_DEPARTMENT_OTHER): Payer: PRIVATE HEALTH INSURANCE

## 2015-11-04 ENCOUNTER — Telehealth: Payer: Self-pay | Admitting: Family Medicine

## 2015-11-04 ENCOUNTER — Ambulatory Visit (HOSPITAL_BASED_OUTPATIENT_CLINIC_OR_DEPARTMENT_OTHER)
Admission: RE | Admit: 2015-11-04 | Discharge: 2015-11-04 | Disposition: A | Discharge status: Home / Self Care | Payer: PRIVATE HEALTH INSURANCE | Source: Ambulatory Visit | Attending: Family Medicine | Admitting: Family Medicine

## 2015-11-04 ENCOUNTER — Other Ambulatory Visit: Payer: Self-pay | Admitting: *Deleted

## 2015-11-04 ENCOUNTER — Encounter (HOSPITAL_BASED_OUTPATIENT_CLINIC_OR_DEPARTMENT_OTHER): Payer: Self-pay

## 2015-11-04 DIAGNOSIS — R634 Abnormal weight loss: Secondary | ICD-10-CM | POA: Insufficient documentation

## 2015-11-04 MED ORDER — IOPAMIDOL (ISOVUE-300) INJECTION 61%
100.0000 mL | Freq: Once | INTRAVENOUS | Status: AC | PRN
  Administered 2015-11-04: 100 mL via INTRAVENOUS

## 2015-11-04 NOTE — Telephone Encounter (Signed)
Please advise      KP 

## 2015-11-04 NOTE — Telephone Encounter (Signed)
Relation to WU:JWJXpt:self Call back number:[phone] Pharmacy:  Reason for call:  Patient stated she had her CT today and was advised results were going to be back by 5pm. Patient stated she doesn't want to worry over the weekend about results and would like a call back today. Please advise

## 2015-11-04 NOTE — Telephone Encounter (Signed)
See ct  

## 2015-12-09 ENCOUNTER — Other Ambulatory Visit: Payer: Self-pay | Admitting: Obstetrics and Gynecology

## 2015-12-09 DIAGNOSIS — R928 Other abnormal and inconclusive findings on diagnostic imaging of breast: Secondary | ICD-10-CM

## 2015-12-14 ENCOUNTER — Ambulatory Visit
Admission: RE | Admit: 2015-12-14 | Discharge: 2015-12-14 | Disposition: A | Discharge status: Home / Self Care | Payer: PRIVATE HEALTH INSURANCE | Source: Ambulatory Visit | Attending: Obstetrics and Gynecology | Admitting: Obstetrics and Gynecology

## 2015-12-14 DIAGNOSIS — R928 Other abnormal and inconclusive findings on diagnostic imaging of breast: Secondary | ICD-10-CM

## 2016-01-09 ENCOUNTER — Other Ambulatory Visit: Payer: Self-pay | Admitting: Family Medicine

## 2016-04-11 ENCOUNTER — Telehealth: Payer: Self-pay | Admitting: Family Medicine

## 2016-04-11 NOTE — Telephone Encounter (Signed)
Caller name:Tristan Mccarn Relationship to patient: Can be reached: Pharmacy:  Reason for call:Patient called requesting follow up appt, requesting blood work due to having headaches. Patient only wanted morning appointment, no AM appointments available till after Thanksgiving, did not want to see PA or NP. Made appointment for 11/28, was upset she could not get in sooner, work patient in sooner?

## 2016-04-11 NOTE — Telephone Encounter (Signed)
Patient scheduled for 04/12/16 @11 :15

## 2016-04-11 NOTE — Telephone Encounter (Signed)
Please get her in sooner 

## 2016-04-12 ENCOUNTER — Ambulatory Visit (INDEPENDENT_AMBULATORY_CARE_PROVIDER_SITE_OTHER): Payer: PRIVATE HEALTH INSURANCE | Admitting: Family Medicine

## 2016-04-12 ENCOUNTER — Encounter: Payer: Self-pay | Admitting: Family Medicine

## 2016-04-12 VITALS — BP 135/90 | HR 84 | Temp 98.6°F | Ht 67.0 in | Wt 128.6 lb

## 2016-04-12 DIAGNOSIS — R002 Palpitations: Secondary | ICD-10-CM

## 2016-04-12 DIAGNOSIS — R079 Chest pain, unspecified: Secondary | ICD-10-CM

## 2016-04-12 DIAGNOSIS — I10 Essential (primary) hypertension: Secondary | ICD-10-CM

## 2016-04-12 DIAGNOSIS — E785 Hyperlipidemia, unspecified: Secondary | ICD-10-CM | POA: Diagnosis not present

## 2016-04-12 LAB — CBC WITH DIFFERENTIAL/PLATELET
Basophils Absolute: 0 10*3/uL (ref 0.0–0.1)
Basophils Relative: 0.5 % (ref 0.0–3.0)
EOS ABS: 0.2 10*3/uL (ref 0.0–0.7)
EOS PCT: 3.4 % (ref 0.0–5.0)
HCT: 39.2 % (ref 36.0–46.0)
HEMOGLOBIN: 13.3 g/dL (ref 12.0–15.0)
Lymphocytes Relative: 37.7 % (ref 12.0–46.0)
Lymphs Abs: 1.8 10*3/uL (ref 0.7–4.0)
MCHC: 34 g/dL (ref 30.0–36.0)
MCV: 92.6 fl (ref 78.0–100.0)
MONO ABS: 0.4 10*3/uL (ref 0.1–1.0)
Monocytes Relative: 7.6 % (ref 3.0–12.0)
Neutro Abs: 2.4 10*3/uL (ref 1.4–7.7)
Neutrophils Relative %: 50.8 % (ref 43.0–77.0)
Platelets: 369 10*3/uL (ref 150.0–400.0)
RBC: 4.23 Mil/uL (ref 3.87–5.11)
RDW: 13.3 % (ref 11.5–15.5)
WBC: 4.8 10*3/uL (ref 4.0–10.5)

## 2016-04-12 LAB — LIPID PANEL
CHOL/HDL RATIO: 3
CHOLESTEROL: 232 mg/dL — AB (ref 0–200)
HDL: 73.5 mg/dL (ref >39.00)
LDL CALC: 145 mg/dL — AB (ref 0–99)
NonHDL: 158.73
TRIGLYCERIDES: 70 mg/dL (ref 0.0–149.0)
VLDL: 14 mg/dL (ref 0.0–40.0)

## 2016-04-12 LAB — COMPREHENSIVE METABOLIC PANEL
ALBUMIN: 4.5 g/dL (ref 3.5–5.2)
ALK PHOS: 53 U/L (ref 39–117)
ALT: 11 U/L (ref 0–35)
AST: 16 U/L (ref 0–37)
BUN: 12 mg/dL (ref 6–23)
CO2: 29 mEq/L (ref 19–32)
CREATININE: 0.79 mg/dL (ref 0.40–1.20)
Calcium: 9.5 mg/dL (ref 8.4–10.5)
Chloride: 101 mEq/L (ref 96–112)
GFR: 81.03 mL/min (ref >60.00)
Glucose, Bld: 84 mg/dL (ref 70–99)
POTASSIUM: 4 meq/L (ref 3.5–5.1)
SODIUM: 137 meq/L (ref 135–145)
TOTAL PROTEIN: 7.9 g/dL (ref 6.0–8.3)
Total Bilirubin: 0.8 mg/dL (ref 0.2–1.2)

## 2016-04-12 LAB — POCT URINALYSIS DIPSTICK
BILIRUBIN UA: NEGATIVE
GLUCOSE UA: NEGATIVE
KETONES UA: NEGATIVE
LEUKOCYTES UA: NEGATIVE
NITRITE UA: NEGATIVE
PH UA: 6.5
Protein, UA: NEGATIVE
RBC UA: NEGATIVE
SPEC GRAV UA: 1.01
UROBILINOGEN UA: 0.2

## 2016-04-12 MED ORDER — METOPROLOL SUCCINATE ER 25 MG PO TB24: 25.0000 mg | ORAL_TABLET | Freq: Every day | ORAL | 3 refills | Status: AC

## 2016-04-12 NOTE — Progress Notes (Signed)
. 0.  Patient ID: Diamond Watts, female    DOB: 04/06/1966  Age: 50 y.o. MRN: [id number]    Subjective:  Subjective  HPI Diamond Watts presents for chest pressure, palpitations x 2 months on and off.  It occurs often.  It will last hours sometimes.   She denies added stress.   Pt does not radiate.    Review of Systems  Constitutional: Negative for appetite change, diaphoresis, fatigue and unexpected weight change.  Eyes: Negative for pain, redness and visual disturbance.  Respiratory: Negative for cough, chest tightness, shortness of breath and wheezing.   Cardiovascular: Positive for chest pain and palpitations. Negative for leg swelling.  Endocrine: Negative for cold intolerance, heat intolerance, polydipsia, polyphagia and polyuria.  Genitourinary: Negative for difficulty urinating, dysuria and frequency.  Neurological: Negative for dizziness, light-headedness, numbness and headaches.    History Past Medical History:  Diagnosis Date  . Anxiety   . Asymptomatic postmenopausal status (age-related) (natural)   . B12 deficiency   . Chest pain    onset May 2010; neg gxt 02/14/09  . Depression   . External hemorrhoids without mention of complication   . Grave's disease    hx   . Hypothyroidism   . Tremor    nec  . Vulvitis     She has a past surgical history that includes urethal tube stretched; Foot surgery; Refractive surgery; abdominoplasty (09/06/2014); and mastoplexy (Bilateral, 09/06/2014).   Her family history includes Depression in her father; Heart disease in her mother; Hyperlipidemia in her mother; Hypertension in her mother; Liver cancer in her father.She reports that she has never smoked. She has never used smokeless tobacco. She reports that she drinks alcohol. She reports that she does not use drugs.  Current Outpatient Prescriptions on File Prior to Visit  Medication Sig Dispense Refill  . clonazePAM (KLONOPIN) 1 MG tablet TAKE 1 TABLET BY MOUTH 2 TIMES A  DAY AS NEEDED FOR ANXIETY 60 tablet 1  . levothyroxine (SYNTHROID, LEVOTHROID) 112 MCG tablet TAKE 1 TABLET BY MOUTH DAILY. 30 tablet 11   No current facility-administered medications on file prior to visit.      Objective:  Objective  Physical Exam  Constitutional: She is oriented to person, place, and time. She appears well-developed and well-nourished.  HENT:  Head: Normocephalic and atraumatic.  Eyes: Conjunctivae and EOM are normal.  Neck: Normal range of motion. Neck supple. No JVD present. Carotid bruit is not present. No thyromegaly present.  Cardiovascular: Normal rate, regular rhythm and normal heart sounds.   No murmur heard. Pulmonary/Chest: Effort normal and breath sounds normal. No respiratory distress. She has no wheezes. She has no rales. She exhibits no tenderness.  Musculoskeletal: She exhibits no edema.  Neurological: She is alert and oriented to person, place, and time.  Psychiatric: She has a normal mood and affect. Her behavior is normal. Thought content normal.  Nursing note and vitals reviewed.  BP 135/90   Pulse 84   Temp 98.6 F (37 C) (Oral)   Ht 5\' 7"  (1.702 m)   Wt 128 lb 9.6 oz (58.3 kg)   SpO2 100%   BMI 20.14 kg/m  Wt Readings from Last 3 Encounters:  04/12/16 128 lb 9.6 oz (58.3 kg)  10/18/15 126 lb 6.4 oz (57.3 kg)  08/25/15 130 lb 3.2 oz (59.1 kg)     Lab Results  Component Value Date   WBC 4.2 10/18/2015   HGB 13.1 10/18/2015   HCT 39.3  10/18/2015   PLT 378.0 10/18/2015   GLUCOSE 85 10/18/2015   CHOL 268 (H) 08/25/2015   TRIG 63.0 08/25/2015   HDL 71.40 08/25/2015   LDLDIRECT 151.2 06/08/2013   LDLCALC 184 (H) 08/25/2015   ALT 12 10/18/2015   AST 16 10/18/2015   NA 137 10/18/2015   K 4.0 10/18/2015   CL 104 10/18/2015   CREATININE 0.80 10/18/2015   BUN 8 10/18/2015   CO2 28 10/18/2015   TSH 2.67 10/18/2015   ekg-- nsr Mm Diag Breast Tomo Uni Right  Result Date: 12/14/2015 CLINICAL DATA:  Recall from 2D screening  mammography, possible focal asymmetry in the central right breast visualized only on the CC view. Patient underwent bilateral breast lift procedures in 2016. EXAM: 2D DIGITAL DIAGNOSTIC UNILATERAL RIGHT MAMMOGRAM WITH CAD AND ADJUNCT TOMO COMPARISON:  12/14/2015, 05/05/2014, 10/29/2011. ACR Breast Density Category c: The breast tissue is heterogeneously dense, which may obscure small masses. FINDINGS: Standard 2D and tomosynthesis full field CC and MLO views of the right breast were obtained. The area questioned on the screening mammogram corresponds to normal fibroglandular tissue. No findings suspicious for malignancy in the right breast. Mammographic images were processed with CAD. IMPRESSION: No mammographic evidence of malignancy, right breast. The area of concern on the screening mammogram is consistent with a summation of overlapping fibroglandular tissue. RECOMMENDATION: Screening mammogram in one year.(Code:SM-B-01Y) I have discussed the findings and recommendations with the patient. Results were also provided in writing at the conclusion of the visit. If applicable, a reminder letter will be sent to the patient regarding the next appointment. BI-RADS CATEGORY  1: Negative. Electronically Signed   By: Hulan Saashomas  Lawrence M.D.   On: 12/14/2015 13:53     Assessment & Plan:  Plan  I have discontinued Diamond Watts's omeprazole and ondansetron. I am also having her maintain her clonazePAM and levothyroxine.  No orders of the defined types were placed in this encounter.   Problem List Items Addressed This Visit      Unprioritized   CHEST PAIN - Primary   Relevant Orders   EKG 12-Lead   Comprehensive metabolic panel   CBC with Differential/Platelet   POCT urinalysis dipstick   Thyroid Panel With TSH    Other Visit Diagnoses    Palpitations       Relevant Orders   Comprehensive metabolic panel   CBC with Differential/Platelet   POCT urinalysis dipstick   Thyroid Panel With TSH    Hyperlipidemia LDL goal <100       Relevant Orders   Lipid panel    if chest pain returns go to ER Follow-up: No Follow-up on file.  Donato SchultzYvonne R Lowne Chase, DO

## 2016-04-12 NOTE — Progress Notes (Signed)
Pre visit review using our clinic tool,if applicable. No additional management support is needed unless otherwise documented below in the visit note.  

## 2016-04-12 NOTE — Progress Notes (Signed)
9

## 2016-04-12 NOTE — Patient Instructions (Signed)
Chest Wall Pain °Chest wall pain is pain in or around the bones and muscles of your chest. Sometimes, an injury causes this pain. Sometimes, the cause may not be known. This pain may take several weeks or longer to get better. °Follow these instructions at home: °Pay attention to any changes in your symptoms. Take these actions to help with your pain: °· Rest as told by your health care provider. °· Avoid activities that cause pain. These include any activities that use your chest muscles or your abdominal and side muscles to lift heavy items. °· If directed, apply ice to the painful area: °¨ Put ice in a plastic bag. °¨ Place a towel between your skin and the bag. °¨ Leave the ice on for 20 minutes, 2-3 times per day. °· Take over-the-counter and prescription medicines only as told by your health care provider. °· Do not use tobacco products, including cigarettes, chewing tobacco, and e-cigarettes. If you need help quitting, ask your health care provider. °· Keep all follow-up visits as told by your health care provider. This is important. °Contact a health care provider if: °· You have a fever. °· Your chest pain becomes worse. °· You have new symptoms. °Get help right away if: °· You have nausea or vomiting. °· You feel sweaty or light-headed. °· You have a cough with phlegm (sputum) or you cough up blood. °· You develop shortness of breath. °This information is not intended to replace advice given to you by your health care provider. Make sure you discuss any questions you have with your health care provider. °Document Released: 05/14/2005 Document Revised: 09/22/2015 Document Reviewed: 08/09/2014 °Elsevier Interactive Patient Education © 2017 [employer] ° °

## 2016-04-13 LAB — THYROID PANEL WITH TSH
Free Thyroxine Index: 3.3 (ref 1.4–3.8)
T3 UPTAKE: 32 % (ref 22–35)
T4, Total: 10.3 ug/dL (ref 4.5–12.0)
TSH: 2.49 mIU/L

## 2016-04-24 ENCOUNTER — Ambulatory Visit: Payer: PRIVATE HEALTH INSURANCE | Admitting: Family Medicine

## 2016-05-17 ENCOUNTER — Other Ambulatory Visit: Payer: Self-pay | Admitting: Family Medicine

## 2016-05-17 DIAGNOSIS — F411 Generalized anxiety disorder: Secondary | ICD-10-CM

## 2016-05-18 NOTE — Telephone Encounter (Signed)
Patient state she will pick up Rx on Tuesday. Rx in the front in folder. LB

## 2016-05-18 NOTE — Telephone Encounter (Signed)
See rx. 

## 2016-05-18 NOTE — Telephone Encounter (Signed)
Last ov 04/12/16. Last fill 10/18/15 #60 0. Please advise. LB

## 2016-05-18 NOTE — Telephone Encounter (Signed)
Rx not found on pt's medication list. LB

## 2017-07-18 IMAGING — CT CT ABD-PELV W/ CM
2 of 5 series · 13 of 36 positions shown, 16 images · IV contrast (iopamidol)
Comparison: None.

CLINICAL DATA: Loss of weight.  Change in bowel habits.

EXAM:
CT CHEST, ABDOMEN, AND PELVIS WITH CONTRAST
TECHNIQUE: Multidetector CT imaging of the chest, abdomen and pelvis was
performed following the standard protocol during bolus
administration of intravenous contrast.
CONTRAST:  100mL AVFPRP-K66 IOPAMIDOL (AVFPRP-K66) INJECTION 61%

[Series 3: cap with 2 · axial · 0.81mm/px · z∈[-615,-75]mm · 10 of 132 slices shown, 13 images]
[im 12/132  mediastinal]
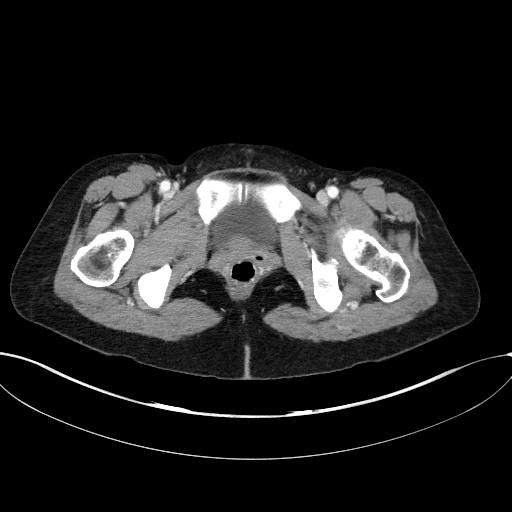
[im 12/132  lung]
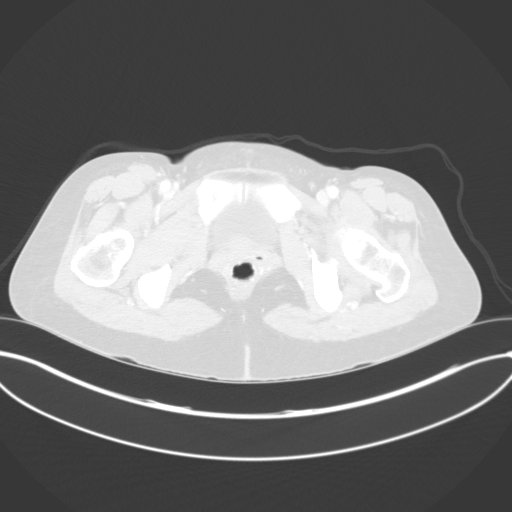
[im 24/132  lung]
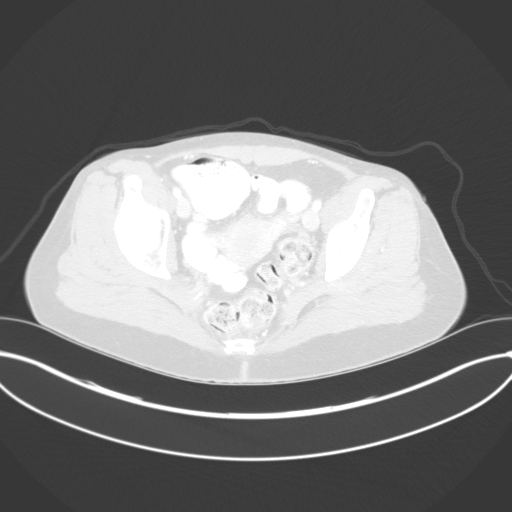
[im 36/132  lung]
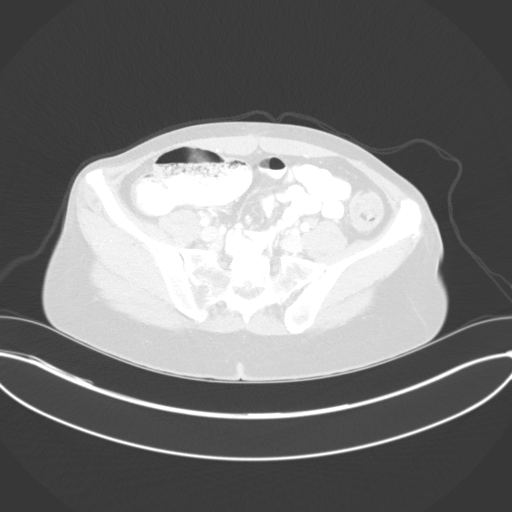
[im 48/132  lung]
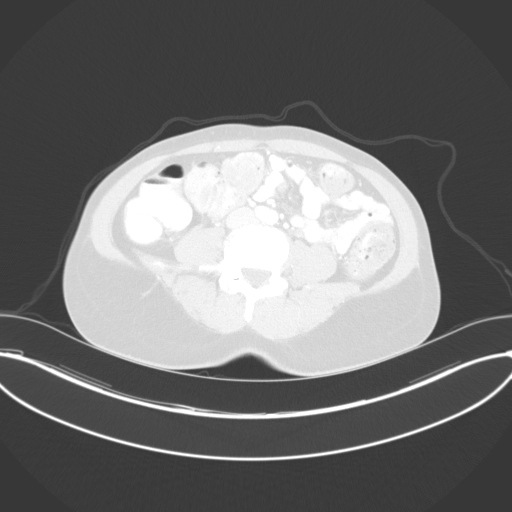
[im 60/132  mediastinal]
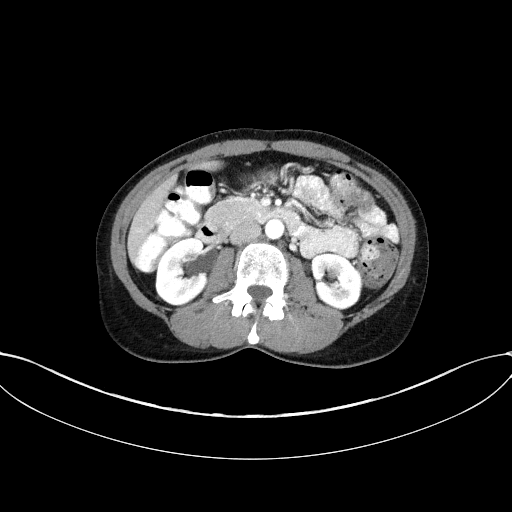
[im 60/132  lung]
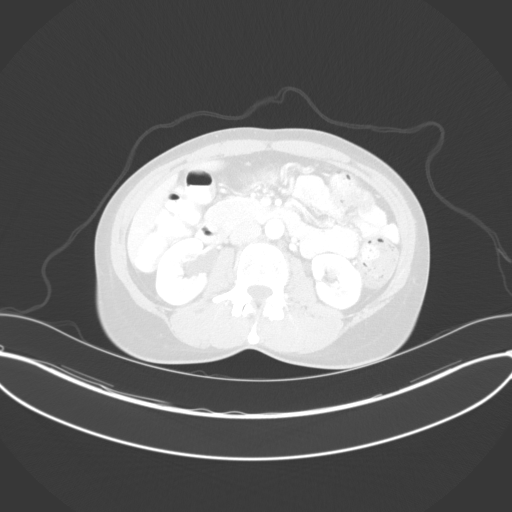
[im 72/132  lung]
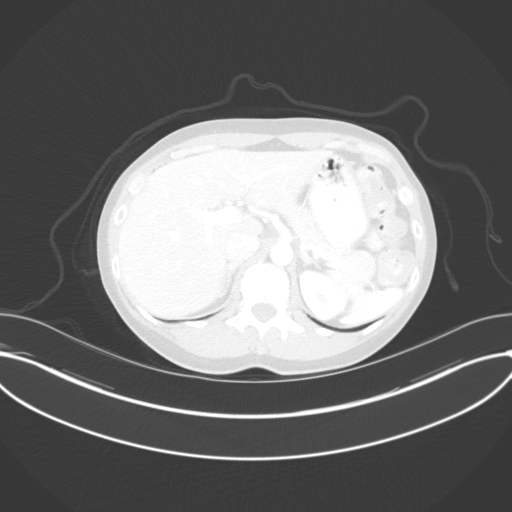
[im 84/132  lung]
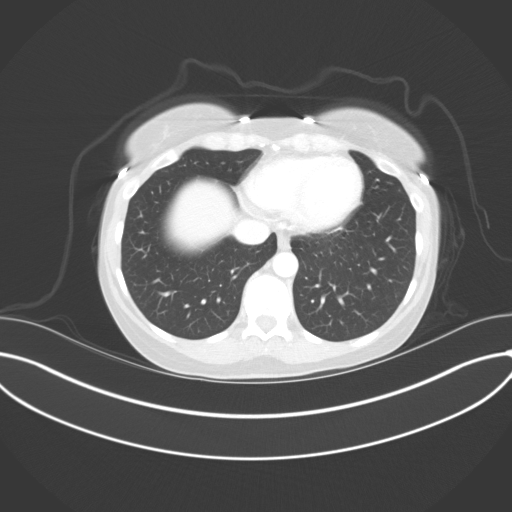
[im 96/132  lung]
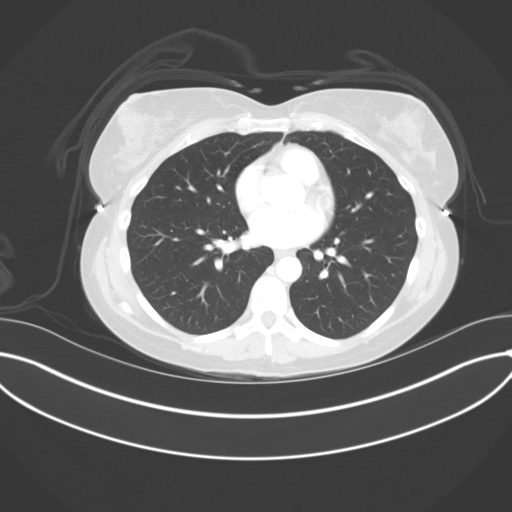
[im 108/132  mediastinal]
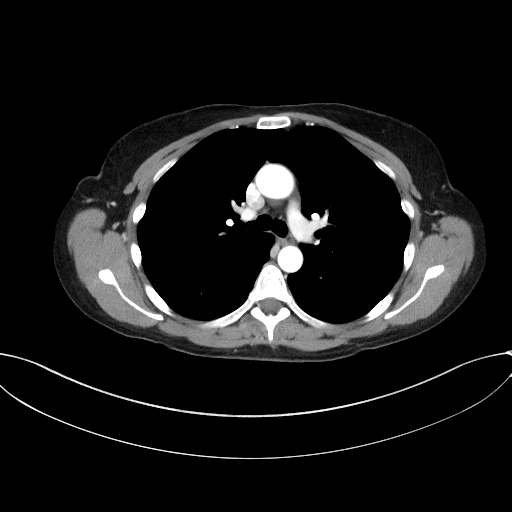
[im 108/132  lung]
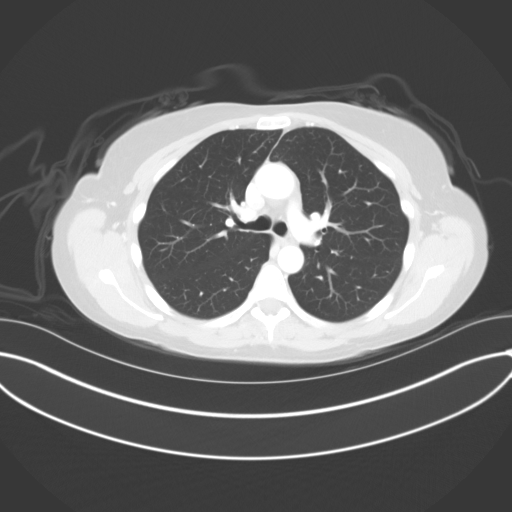
[im 120/132  lung]
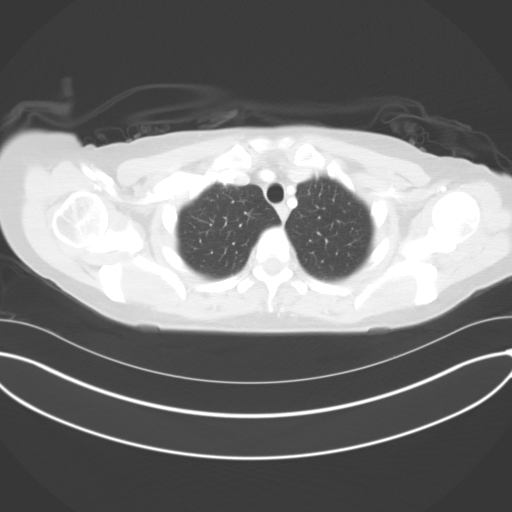

[Series 6: coronals · coronal · 1.02mm/px · 3 of 108 slices shown]
[im 22/108  lung]
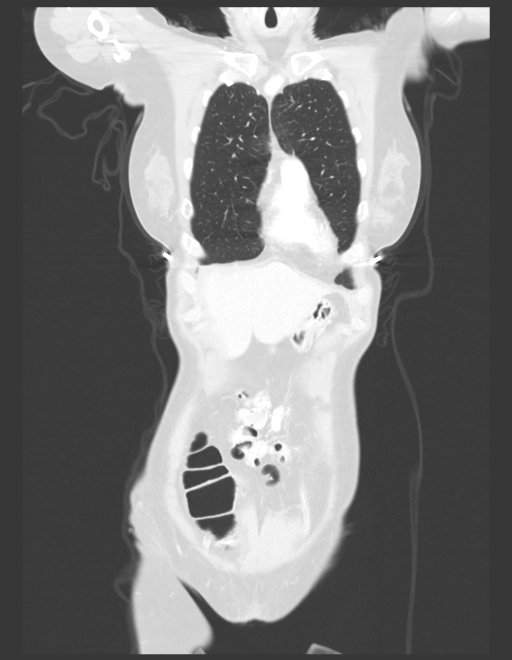
[im 43/108  lung]
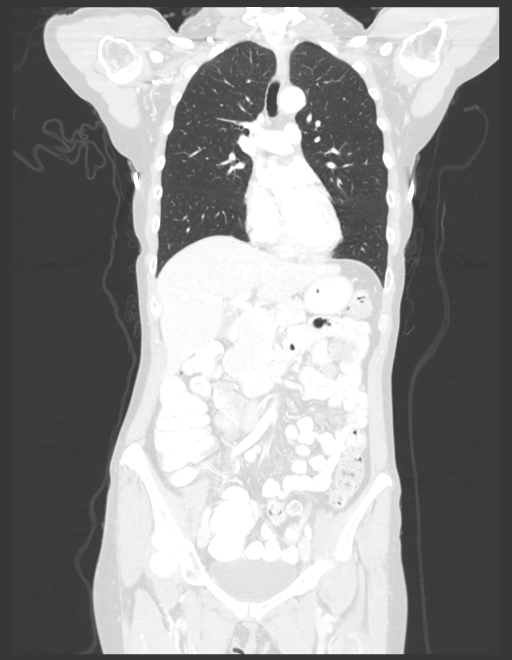
[im 65/108  lung]
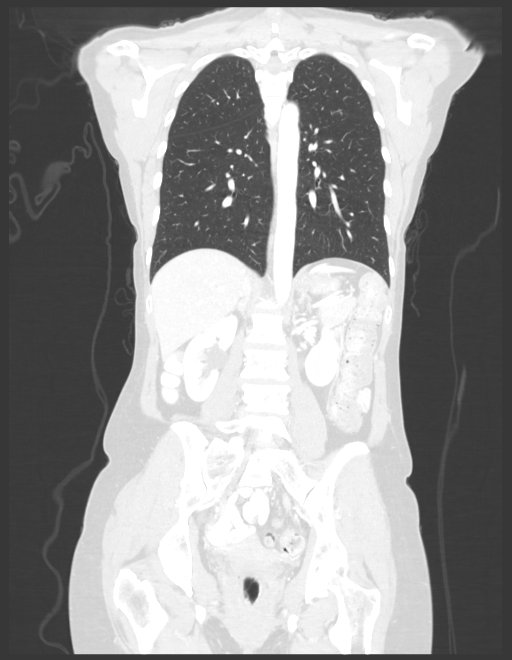

[13 of 36 positions shown; findings below may reference images not displayed]

FINDINGS: CT CHEST FINDINGS

Mediastinum/Lymph Nodes: No masses, pathologically enlarged lymph
nodes, or other significant abnormality.

Lungs/Pleura: No pulmonary mass, infiltrate, or effusion.

Musculoskeletal: No chest wall mass or suspicious bone lesions
identified.

CT ABDOMEN PELVIS FINDINGS

Hepatobiliary: No masses or other significant abnormality.

Pancreas: No mass, inflammatory changes, or other significant
abnormality.

Spleen: Within normal limits in size and appearance.

Adrenals/Urinary Tract: Normal adrenal glands.

Stomach/Bowel: The stomach is within normal limits. The small bowel
loops have a normal course and caliber. No obstruction. Normal
appearance of the colon.

Vascular/Lymphatic: No pathologically enlarged lymph nodes. No
evidence of abdominal aortic aneurysm.

Reproductive: No mass or other significant abnormality.

Other: There is no ascites or focal fluid collections within the
abdomen or pelvis.

Musculoskeletal:  No aggressive lytic or sclerotic bone lesion
IMPRESSION: 1. No acute findings identified within the chest, abdomen or pelvis.
2. No mass or adenopathy identified to explain patient's weight
loss.

## 2018-03-17 ENCOUNTER — Encounter: Payer: Self-pay | Admitting: Family Medicine

## 2018-07-21 ENCOUNTER — Other Ambulatory Visit: Payer: Self-pay | Admitting: Family Medicine

## 2018-07-21 DIAGNOSIS — N939 Abnormal uterine and vaginal bleeding, unspecified: Secondary | ICD-10-CM
